# Patient Record
Sex: Male | Born: 1940
Health system: Southern US, Community
[De-identification: ages and names within clinical notes are randomized; demographics above are authoritative.]

## PROBLEM LIST (undated history)

## (undated) DIAGNOSIS — C801 Malignant (primary) neoplasm, unspecified: Secondary | ICD-10-CM

## (undated) DIAGNOSIS — E785 Hyperlipidemia, unspecified: Secondary | ICD-10-CM

## (undated) DIAGNOSIS — H409 Unspecified glaucoma: Secondary | ICD-10-CM

## (undated) DIAGNOSIS — F0390 Unspecified dementia without behavioral disturbance: Secondary | ICD-10-CM

## (undated) DIAGNOSIS — E119 Type 2 diabetes mellitus without complications: Secondary | ICD-10-CM

## (undated) DIAGNOSIS — M1990 Unspecified osteoarthritis, unspecified site: Secondary | ICD-10-CM

## (undated) HISTORY — PX: KNEE ARTHROSCOPY: SUR90

## (undated) HISTORY — DX: Unspecified dementia, unspecified severity, without behavioral disturbance, psychotic disturbance, mood disturbance, and anxiety: F03.90

## (undated) HISTORY — PX: KNEE SURGERY: SHX244

## (undated) HISTORY — PX: HERNIA REPAIR: SHX51

## (undated) HISTORY — PX: PROSTATE SURGERY: SHX751

## (undated) HISTORY — PX: BIOPSY PENIS: PRO27

---

## 2005-06-08 ENCOUNTER — Ambulatory Visit: Payer: Self-pay | Admitting: Specialist

## 2005-06-29 ENCOUNTER — Ambulatory Visit: Payer: Self-pay | Admitting: Specialist

## 2005-06-30 ENCOUNTER — Other Ambulatory Visit: Payer: Self-pay

## 2005-06-30 ENCOUNTER — Ambulatory Visit: Payer: Self-pay | Admitting: Specialist

## 2005-07-05 ENCOUNTER — Ambulatory Visit: Payer: Self-pay | Admitting: Specialist

## 2015-08-15 ENCOUNTER — Encounter: Payer: Self-pay | Admitting: Gynecology

## 2015-08-15 ENCOUNTER — Ambulatory Visit
Admission: EM | Admit: 2015-08-15 | Discharge: 2015-08-15 | Disposition: A | Discharge status: Home / Self Care | Payer: Medicare Other | Attending: Family Medicine | Admitting: Family Medicine

## 2015-08-15 DIAGNOSIS — L739 Follicular disorder, unspecified: Secondary | ICD-10-CM | POA: Diagnosis not present

## 2015-08-15 HISTORY — DX: Hyperlipidemia, unspecified: E78.5

## 2015-08-15 HISTORY — DX: Type 2 diabetes mellitus without complications: E11.9

## 2015-08-15 HISTORY — DX: Unspecified glaucoma: H40.9

## 2015-08-15 HISTORY — DX: Malignant (primary) neoplasm, unspecified: C80.1

## 2015-08-15 HISTORY — DX: Unspecified osteoarthritis, unspecified site: M19.90

## 2015-08-15 MED ORDER — CEPHALEXIN 500 MG PO CAPS: 500.0000 mg | ORAL_CAPSULE | Freq: Four times a day (QID) | ORAL | Status: DC

## 2015-08-15 NOTE — Discharge Instructions (Signed)
Be sure to take all of your antibiotics Use warm moist compresses for 20 minutes 4 times daily Use Bactroban twice daily  Keep area clean and dry with soap and water You may use ibuprofen as needed as directed for pain   Folliculitis Folliculitis is redness, soreness, and swelling (inflammation) of the hair follicles. This condition can occur anywhere on the body. People with weakened immune systems, diabetes, or obesity have a greater risk of getting folliculitis. CAUSES  Bacterial infection. This is the most common cause.  Fungal infection.  Viral infection.  Contact with certain chemicals, especially oils and tars. Long-term folliculitis can result from bacteria that live in the nostrils. The bacteria may trigger multiple outbreaks of folliculitis over time. SYMPTOMS Folliculitis most commonly occurs on the scalp, thighs, legs, back, buttocks, and areas where hair is shaved frequently. An early sign of folliculitis is a small, white or yellow, pus-filled, itchy lesion (pustule). These lesions appear on a red, inflamed follicle. They are usually less than 0.2 inches (5 mm) wide. When there is an infection of the follicle that goes deeper, it becomes a boil or furuncle. A group of closely packed boils creates a larger lesion (carbuncle). Carbuncles tend to occur in hairy, sweaty areas of the body. DIAGNOSIS  Your caregiver can usually tell what is wrong by doing a physical exam. A sample may be taken from one of the lesions and tested in a lab. This can help determine what is causing your folliculitis. TREATMENT  Treatment may include:  Applying warm compresses to the affected areas.  Taking antibiotic medicines orally or applying them to the skin.  Draining the lesions if they contain a large amount of pus or fluid.  Laser hair removal for cases of long-lasting folliculitis. This helps to prevent regrowth of the hair. HOME CARE INSTRUCTIONS  Apply warm compresses to the affected  areas as directed by your caregiver.  If antibiotics are prescribed, take them as directed. Finish them even if you start to feel better.  You may take over-the-counter medicines to relieve itching.  Do not shave irritated skin.  Follow up with your caregiver as directed. SEEK IMMEDIATE MEDICAL CARE IF:   You have increasing redness, swelling, or pain in the affected area.  You have a fever. MAKE SURE YOU:  Understand these instructions.  Will watch your condition.  Will get help right away if you are not doing well or get worse.   This information is not intended to replace advice given to you by your health care provider. Make sure you discuss any questions you have with your health care provider.   Document Released: 12/12/2001 Document Revised: 10/24/2014 Document Reviewed: 01/03/2012 Elsevier Interactive Patient Education [employer].

## 2015-08-15 NOTE — ED Provider Notes (Signed)
CSN: [id number]     Arrival date & time 08/15/15  0907 History   First MD Initiated Contact with Patient 08/15/15 1023     Chief Complaint  Patient presents with  . Mass    HPI William Valentine is a pleasant 74 y.o. male who presents with 2 weeks of bump on the back of his head. He states he went to the barber and noticed some swelling at his occiput. Denies any pain. He has noticed a clear bloody discharge over the last 24 hours. He denies any adenopathy or neck stiffness.  Denies any fever or chills. Denies any insect bites. Denies any rash. Past Medical History  Diagnosis Date  . Arthritis   . Diabetes mellitus without complication ([place])   . Glaucoma   . Cancer [facility])     prostate  . Dyslipidemia    Past Surgical History  Procedure Laterality Date  . Prostate surgery    . Hernia repair    . Knee arthroscopy Right   . Knee surgery Left   . Biopsy penis     No family history on file. Social History  Substance Use Topics  . Smoking status: Never Smoker   . Smokeless tobacco: None  . Alcohol Use: No    Review of Systems  Constitutional: Negative.   HENT: Negative.   Eyes: Negative.   Respiratory: Negative.   Cardiovascular: Negative.   Gastrointestinal: Negative.   Genitourinary: Negative.   Musculoskeletal: Negative.   Skin: Negative for color change, pallor and rash.  Neurological: Negative.   Hematological: Negative.   Psychiatric/Behavioral: Negative.     Allergies  Review of patient's allergies indicates no known allergies.  Home Medications   Prior to Admission medications   Medication Sig Start Date End Date Taking? Authorizing Provider  atorvastatin (LIPITOR) 20 MG tablet Take 20 mg by mouth daily.   Yes Historical Provider, MD  dorzolamide (TRUSOPT) 2 % ophthalmic solution 1 drop 3 (three) times daily.   Yes Historical Provider, MD  latanoprost (XALATAN) 0.005 % ophthalmic solution 1 drop at bedtime.   Yes Historical Provider, MD    lisinopril-hydrochlorothiazide (PRINZIDE,ZESTORETIC) 20-12.5 MG tablet Take 1 tablet by mouth daily.   Yes Historical Provider, MD  cephALEXin (KEFLEX) 500 MG capsule Take 1 capsule (500 mg total) by mouth 4 (four) times daily. 08/15/15   Andria Meuse, NP   Meds Ordered and Administered this Visit  Medications - No data to display  BP 149/72 mmHg  Pulse 64  Temp(Src) 97.5 F (36.4 C) (Oral)  Resp 16  Ht 5\' 11"  (1.803 m)  Wt 160 lb (72.576 kg)  BMI 22.33 kg/m2  SpO2 100% No data found.   Physical Exam  Constitutional: He is oriented to person, place, and time. He appears well-developed and well-nourished. No distress.  HENT:  Head: Normocephalic and atraumatic.  Eyes: Conjunctivae are normal. No scleral icterus.  Neck: Normal range of motion.  Cardiovascular: Normal rate, regular rhythm and normal heart sounds.   Pulmonary/Chest: Effort normal and breath sounds normal. No respiratory distress.  Abdominal: Soft. Bowel sounds are normal. He exhibits no distension.  Musculoskeletal: Normal range of motion. He exhibits no edema or tenderness.  Good ROM neck No adenopathy  Neurological: He is alert and oriented to person, place, and time. No cranial nerve deficit.  Skin: Skin is warm and dry. Lesion noted. No rash noted. No cyanosis. Nails show no clubbing.     Psychiatric: He has a normal mood and affect.  His speech is normal and behavior is normal. Judgment and thought content normal. Cognition and memory are normal.  Nursing note and vitals reviewed.   ED Course  Procedures (including critical care time)  MDM   1. Folliculitis   He has good spontaneous drainage.  Will cover with 10 days of antibiotics.  Plan: Rx as per orders;  benefits, risks, potential side effects reviewed.   warm moist compresses for 20 minutes 4 times daily Use Bactroban twice daily  Keep area clean and dry with soap and water ibuprofen as needed as directed for pain  Seek additional medical  care if symptoms worsen or are not improving in next 2-3 days     Andria Meuse, NP 08/15/15 1037

## 2015-08-15 NOTE — ED Notes (Cosign Needed)
Patient stated x 3 week notice bump on back of head. Per patient thought is was an hair bump. Pt. Now stated drainage and painful.

## 2015-08-18 ENCOUNTER — Encounter: Payer: Self-pay | Admitting: Emergency Medicine

## 2015-08-18 ENCOUNTER — Ambulatory Visit
Admission: EM | Admit: 2015-08-18 | Discharge: 2015-08-18 | Disposition: A | Discharge status: Home / Self Care | Payer: Medicare Other | Attending: Family Medicine | Admitting: Family Medicine

## 2015-08-18 DIAGNOSIS — L02811 Cutaneous abscess of head [any part, except face]: Secondary | ICD-10-CM

## 2015-08-18 MED ORDER — SULFAMETHOXAZOLE-TRIMETHOPRIM 800-160 MG PO TABS: 1.0000 | ORAL_TABLET | Freq: Two times a day (BID) | ORAL | Status: DC

## 2015-08-18 MED ORDER — MUPIROCIN 2 % EX OINT: 1.0000 "application " | TOPICAL_OINTMENT | Freq: Three times a day (TID) | CUTANEOUS | Status: DC

## 2015-08-18 NOTE — Discharge Instructions (Signed)
Abscess °An abscess (boil or furuncle) is an infected area on or under the skin. This area is filled with yellowish-white fluid (pus) and other material (debris). °HOME CARE  °· Only take medicines as told by your doctor. °· If you were given antibiotic medicine, take it as directed. Finish the medicine even if you start to feel better. °· If gauze is used, follow your doctor's directions for changing the gauze. °· To avoid spreading the infection: °¨ Keep your abscess covered with a bandage. °¨ Wash your hands well. °¨ Do not share personal care items, towels, or whirlpools with others. °¨ Avoid skin contact with others. °· Keep your skin and clothes clean around the abscess. °· Keep all doctor visits as told. °GET HELP RIGHT AWAY IF:  °· You have more pain, puffiness (swelling), or redness in the wound site. °· You have more fluid or blood coming from the wound site. °· You have muscle aches, chills, or you feel sick. °· You have a fever. °MAKE SURE YOU:  °· Understand these instructions. °· Will watch your condition. °· Will get help right away if you are not doing well or get worse. °  °This information is not intended to replace advice given to you by your health care provider. Make sure you discuss any questions you have with your health care provider. °  °Document Released: 03/21/2008 Document Revised: 04/03/2012 Document Reviewed: 12/17/2011 °Elsevier Interactive Patient Education ©2016 Elsevier Inc. ° °

## 2015-08-18 NOTE — ED Notes (Signed)
Patient here for follow-up visit to abscess on his head.

## 2015-08-18 NOTE — ED Provider Notes (Signed)
CSN: [id number]     Arrival date & time 08/18/15  1324 History   First MD Initiated Contact with Patient 08/18/15 [phone]    Nurses notes were reviewed.  Chief Complaint  Patient presents with  . Abscess   patient lesion on baclofen for about 2 weeks. He was seen Saturday placed on Keflex 504 times a day which she states really hasn't helped. There is a note for him to use Bactroban but I do not see that a prescription was actually given to him before and he states that using anything as far as an ointment. (Consider location/radiation/quality/duration/timing/severity/associated sxs/prior Treatment) Patient is a 74 y.o. male presenting with abscess. The history is provided by the patient. No language interpreter was used.  Abscess Location:  Head/neck Abscess quality: draining, induration, painful, redness and warmth   Abscess quality: no fluctuance   Red streaking: no   Duration:  2 weeks Progression:  Worsening Pain details:    Quality:  Pressure and throbbing Chronicity:  New Context: diabetes   Relieved by:  Nothing Ineffective treatments:  Oral antibiotics Associated symptoms: no anorexia, no fatigue, no fever, no headaches, no nausea and no vomiting   Risk factors: no family hx of MRSA and no hx of MRSA     Past Medical History  Diagnosis Date  . Arthritis   . Diabetes mellitus without complication ([place])   . Glaucoma   . Cancer [facility])     prostate  . Dyslipidemia    Past Surgical History  Procedure Laterality Date  . Prostate surgery    . Hernia repair    . Knee arthroscopy Right   . Knee surgery Left   . Biopsy penis     History reviewed. No pertinent family history. Social History  Substance Use Topics  . Smoking status: Never Smoker   . Smokeless tobacco: None  . Alcohol Use: No    Review of Systems  Constitutional: Negative for fever and fatigue.  Gastrointestinal: Negative for nausea, vomiting and anorexia.  Neurological: Negative for headaches.  All  other systems reviewed and are negative.   Allergies  Review of patient's allergies indicates no known allergies.  Home Medications   Prior to Admission medications   Medication Sig Start Date End Date Taking? Authorizing Provider  atorvastatin (LIPITOR) 20 MG tablet Take 20 mg by mouth daily.    Historical Provider, MD  cephALEXin (KEFLEX) 500 MG capsule Take 1 capsule (500 mg total) by mouth 4 (four) times daily. 08/15/15   Andria Meuse, NP  dorzolamide (TRUSOPT) 2 % ophthalmic solution 1 drop 3 (three) times daily.    Historical Provider, MD  latanoprost (XALATAN) 0.005 % ophthalmic solution 1 drop at bedtime.    Historical Provider, MD  lisinopril-hydrochlorothiazide (PRINZIDE,ZESTORETIC) 20-12.5 MG tablet Take 1 tablet by mouth daily.    Historical Provider, MD  mupirocin ointment (BACTROBAN) 2 % Apply 1 application topically 3 (three) times daily. 08/18/15   Frederich Cha, MD  sulfamethoxazole-trimethoprim (BACTRIM DS,SEPTRA DS) 800-160 MG tablet Take 1 tablet by mouth 2 (two) times daily. 08/18/15   Frederich Cha, MD   Meds Ordered and Administered this Visit  Medications - No data to display  BP 152/76 mmHg  Pulse 72  Temp(Src) 96.9 F (36.1 C) (Tympanic)  Resp 17  Ht 5\' 11"  (1.803 m)  Wt 170 lb (77.111 kg)  BMI 23.72 kg/m2  SpO2 100% No data found.   Physical Exam  Constitutional: He is oriented to person, place, and time. He  appears well-developed and well-nourished.  HENT:  Head: Normocephalic and atraumatic.  Eyes: Pupils are equal, round, and reactive to light.  Neck: Neck supple.  Musculoskeletal: Normal range of motion.  Neurological: He is alert and oriented to person, place, and time.  Skin: Rash noted. There is erythema.  Patient has abscess on the back of the head just below except for area with pressure drainage and pus elicited from the abscess.  Vitals reviewed.   ED Course  Procedures (including critical care time)  Labs Review Labs Reviewed - No  data to display  Imaging Review No results found.   Visual Acuity Review  Right Eye Distance:   Left Eye Distance:   Bilateral Distance:    Right Eye Near:   Left Eye Near:    Bilateral Near:         MDM   1. Abscess of scalp    Abscess scalp appears be worsening. Not fluctuant because of straining. Place on Septra DS 1 tablet twice a day stop the Keflex. Place on Bactrim and ointment applied to this lesion 3 times a day for the next 10 days return to office follow-up next Monday 6 days from now. But if becomes worse to please come in Thursday for me to reevaluate.    Frederich Cha, MD 08/18/15 1009

## 2018-11-26 DIAGNOSIS — E785 Hyperlipidemia, unspecified: Secondary | ICD-10-CM | POA: Diagnosis not present

## 2018-11-26 DIAGNOSIS — R7303 Prediabetes: Secondary | ICD-10-CM | POA: Diagnosis not present

## 2018-11-26 DIAGNOSIS — Z0000 Encounter for general adult medical examination without abnormal findings: Secondary | ICD-10-CM | POA: Diagnosis not present

## 2018-11-26 DIAGNOSIS — E7800 Pure hypercholesterolemia, unspecified: Secondary | ICD-10-CM | POA: Diagnosis not present

## 2019-05-08 ENCOUNTER — Other Ambulatory Visit: Payer: Self-pay

## 2019-05-08 NOTE — Patient Outreach (Signed)
[facility]) Care Management  05/08/2019  William Valentine 27-Feb-1941 [id number]   Medication Adherence call to Mr. Armanie Martine left a message  for patient to call back patient is past due on Lisinopril/Hctz 20/12.5 mg under [place].   [place] Management Direct Dial [phone]  Fax [phone] [email or web address]

## 2019-05-27 ENCOUNTER — Other Ambulatory Visit: Payer: Self-pay

## 2019-05-27 NOTE — Patient Outreach (Signed)
[facility]) Care Management  05/27/2019  DACARI BECKSTRAND Mar 31, 1941 [id number]   Medication Adherence call to Mr. Reno Clasby HIPPA Compliant Voice message left with a call back number. Mr. Ptacek is showing past due on Lisinopril/Hctz 20/12.5 mg under [place].   [place] Management Direct Dial [phone]  Fax [phone] [email or web address]

## 2019-05-28 DIAGNOSIS — R7303 Prediabetes: Secondary | ICD-10-CM | POA: Diagnosis not present

## 2019-05-28 DIAGNOSIS — M1711 Unilateral primary osteoarthritis, right knee: Secondary | ICD-10-CM | POA: Diagnosis not present

## 2019-05-28 DIAGNOSIS — I10 Essential (primary) hypertension: Secondary | ICD-10-CM | POA: Diagnosis not present

## 2019-06-27 ENCOUNTER — Other Ambulatory Visit: Payer: Self-pay

## 2019-06-27 DIAGNOSIS — Z20822 Contact with and (suspected) exposure to covid-19: Secondary | ICD-10-CM

## 2019-06-28 LAB — NOVEL CORONAVIRUS, NAA: SARS-CoV-2, NAA: NOT DETECTED

## 2019-07-03 ENCOUNTER — Telehealth: Payer: Self-pay | Admitting: General Practice

## 2019-07-03 NOTE — Telephone Encounter (Signed)
Negative COVID results given. Patient results "NOT Detected." Caller expressed understanding. ° °

## 2019-07-11 DIAGNOSIS — M25461 Effusion, right knee: Secondary | ICD-10-CM | POA: Diagnosis not present

## 2019-07-11 DIAGNOSIS — M1711 Unilateral primary osteoarthritis, right knee: Secondary | ICD-10-CM | POA: Diagnosis not present

## 2019-07-11 DIAGNOSIS — M25561 Pain in right knee: Secondary | ICD-10-CM | POA: Diagnosis not present

## 2019-07-11 DIAGNOSIS — M170 Bilateral primary osteoarthritis of knee: Secondary | ICD-10-CM | POA: Diagnosis not present

## 2019-07-11 DIAGNOSIS — G8929 Other chronic pain: Secondary | ICD-10-CM | POA: Diagnosis not present

## 2019-07-22 ENCOUNTER — Other Ambulatory Visit: Payer: Self-pay

## 2019-07-22 DIAGNOSIS — Z20822 Contact with and (suspected) exposure to covid-19: Secondary | ICD-10-CM

## 2019-07-22 DIAGNOSIS — Z20828 Contact with and (suspected) exposure to other viral communicable diseases: Secondary | ICD-10-CM

## 2019-07-24 LAB — NOVEL CORONAVIRUS, NAA: SARS-CoV-2, NAA: NOT DETECTED

## 2019-07-29 DIAGNOSIS — G8929 Other chronic pain: Secondary | ICD-10-CM | POA: Diagnosis not present

## 2019-07-29 DIAGNOSIS — M25561 Pain in right knee: Secondary | ICD-10-CM | POA: Diagnosis not present

## 2019-07-29 DIAGNOSIS — I10 Essential (primary) hypertension: Secondary | ICD-10-CM | POA: Diagnosis not present

## 2019-07-29 DIAGNOSIS — M1711 Unilateral primary osteoarthritis, right knee: Secondary | ICD-10-CM | POA: Diagnosis not present

## 2019-07-29 DIAGNOSIS — E559 Vitamin D deficiency, unspecified: Secondary | ICD-10-CM | POA: Diagnosis not present

## 2019-07-29 DIAGNOSIS — M1710 Unilateral primary osteoarthritis, unspecified knee: Secondary | ICD-10-CM | POA: Diagnosis not present

## 2019-07-29 DIAGNOSIS — D649 Anemia, unspecified: Secondary | ICD-10-CM | POA: Diagnosis not present

## 2019-08-05 ENCOUNTER — Other Ambulatory Visit: Payer: Self-pay

## 2019-08-05 DIAGNOSIS — Z20822 Contact with and (suspected) exposure to covid-19: Secondary | ICD-10-CM

## 2019-08-07 LAB — NOVEL CORONAVIRUS, NAA: SARS-CoV-2, NAA: NOT DETECTED

## 2019-08-12 DIAGNOSIS — Z01812 Encounter for preprocedural laboratory examination: Secondary | ICD-10-CM | POA: Diagnosis not present

## 2019-08-14 DIAGNOSIS — Z791 Long term (current) use of non-steroidal anti-inflammatories (NSAID): Secondary | ICD-10-CM | POA: Diagnosis not present

## 2019-08-14 DIAGNOSIS — E559 Vitamin D deficiency, unspecified: Secondary | ICD-10-CM | POA: Diagnosis not present

## 2019-08-14 DIAGNOSIS — Z7982 Long term (current) use of aspirin: Secondary | ICD-10-CM | POA: Diagnosis not present

## 2019-08-14 DIAGNOSIS — M1711 Unilateral primary osteoarthritis, right knee: Secondary | ICD-10-CM | POA: Diagnosis not present

## 2019-08-14 DIAGNOSIS — E119 Type 2 diabetes mellitus without complications: Secondary | ICD-10-CM | POA: Diagnosis not present

## 2019-08-14 DIAGNOSIS — Z79899 Other long term (current) drug therapy: Secondary | ICD-10-CM | POA: Diagnosis not present

## 2019-08-14 DIAGNOSIS — G8918 Other acute postprocedural pain: Secondary | ICD-10-CM | POA: Diagnosis not present

## 2019-08-14 DIAGNOSIS — I10 Essential (primary) hypertension: Secondary | ICD-10-CM | POA: Diagnosis not present

## 2019-08-14 DIAGNOSIS — E785 Hyperlipidemia, unspecified: Secondary | ICD-10-CM | POA: Diagnosis not present

## 2019-08-15 DIAGNOSIS — M1711 Unilateral primary osteoarthritis, right knee: Secondary | ICD-10-CM | POA: Diagnosis not present

## 2019-08-15 DIAGNOSIS — E559 Vitamin D deficiency, unspecified: Secondary | ICD-10-CM | POA: Diagnosis not present

## 2019-08-15 DIAGNOSIS — E785 Hyperlipidemia, unspecified: Secondary | ICD-10-CM | POA: Diagnosis not present

## 2019-08-15 DIAGNOSIS — E119 Type 2 diabetes mellitus without complications: Secondary | ICD-10-CM | POA: Diagnosis not present

## 2019-08-15 DIAGNOSIS — G8918 Other acute postprocedural pain: Secondary | ICD-10-CM | POA: Diagnosis not present

## 2019-08-15 DIAGNOSIS — I10 Essential (primary) hypertension: Secondary | ICD-10-CM | POA: Diagnosis not present

## 2019-08-15 DIAGNOSIS — Z791 Long term (current) use of non-steroidal anti-inflammatories (NSAID): Secondary | ICD-10-CM | POA: Diagnosis not present

## 2019-08-15 DIAGNOSIS — Z79899 Other long term (current) drug therapy: Secondary | ICD-10-CM | POA: Diagnosis not present

## 2019-08-15 DIAGNOSIS — Z7982 Long term (current) use of aspirin: Secondary | ICD-10-CM | POA: Diagnosis not present

## 2019-08-16 DIAGNOSIS — M25561 Pain in right knee: Secondary | ICD-10-CM | POA: Diagnosis not present

## 2019-08-16 DIAGNOSIS — E559 Vitamin D deficiency, unspecified: Secondary | ICD-10-CM | POA: Diagnosis not present

## 2019-08-16 DIAGNOSIS — G8929 Other chronic pain: Secondary | ICD-10-CM | POA: Diagnosis not present

## 2019-08-16 DIAGNOSIS — G8918 Other acute postprocedural pain: Secondary | ICD-10-CM | POA: Diagnosis not present

## 2019-08-16 DIAGNOSIS — Z79899 Other long term (current) drug therapy: Secondary | ICD-10-CM | POA: Diagnosis not present

## 2019-08-16 DIAGNOSIS — Z7982 Long term (current) use of aspirin: Secondary | ICD-10-CM | POA: Diagnosis not present

## 2019-08-16 DIAGNOSIS — E785 Hyperlipidemia, unspecified: Secondary | ICD-10-CM | POA: Diagnosis not present

## 2019-08-16 DIAGNOSIS — E119 Type 2 diabetes mellitus without complications: Secondary | ICD-10-CM | POA: Diagnosis not present

## 2019-08-16 DIAGNOSIS — I10 Essential (primary) hypertension: Secondary | ICD-10-CM | POA: Diagnosis not present

## 2019-08-16 DIAGNOSIS — Z791 Long term (current) use of non-steroidal anti-inflammatories (NSAID): Secondary | ICD-10-CM | POA: Diagnosis not present

## 2019-08-16 DIAGNOSIS — M1711 Unilateral primary osteoarthritis, right knee: Secondary | ICD-10-CM | POA: Diagnosis not present

## 2019-08-29 DIAGNOSIS — M1711 Unilateral primary osteoarthritis, right knee: Secondary | ICD-10-CM | POA: Diagnosis not present

## 2019-08-30 DIAGNOSIS — Z4789 Encounter for other orthopedic aftercare: Secondary | ICD-10-CM | POA: Diagnosis not present

## 2019-09-03 DIAGNOSIS — Z4789 Encounter for other orthopedic aftercare: Secondary | ICD-10-CM | POA: Diagnosis not present

## 2019-09-06 DIAGNOSIS — Z4789 Encounter for other orthopedic aftercare: Secondary | ICD-10-CM | POA: Diagnosis not present

## 2019-09-09 DIAGNOSIS — Z4789 Encounter for other orthopedic aftercare: Secondary | ICD-10-CM | POA: Diagnosis not present

## 2019-09-20 DIAGNOSIS — Z4789 Encounter for other orthopedic aftercare: Secondary | ICD-10-CM | POA: Diagnosis not present

## 2019-09-30 DIAGNOSIS — Z96653 Presence of artificial knee joint, bilateral: Secondary | ICD-10-CM | POA: Diagnosis not present

## 2019-09-30 DIAGNOSIS — Z471 Aftercare following joint replacement surgery: Secondary | ICD-10-CM | POA: Diagnosis not present

## 2021-09-20 ENCOUNTER — Emergency Department
Admission: EM | Admit: 2021-09-20 | Discharge: 2021-09-20 | Disposition: A | Discharge status: Home / Self Care | Payer: Medicare HMO | Attending: Emergency Medicine | Admitting: Emergency Medicine

## 2021-09-20 ENCOUNTER — Emergency Department: Payer: Medicare HMO

## 2021-09-20 ENCOUNTER — Other Ambulatory Visit: Payer: Self-pay

## 2021-09-20 DIAGNOSIS — Z5321 Procedure and treatment not carried out due to patient leaving prior to being seen by health care provider: Secondary | ICD-10-CM | POA: Insufficient documentation

## 2021-09-20 DIAGNOSIS — W19XXXA Unspecified fall, initial encounter: Secondary | ICD-10-CM | POA: Insufficient documentation

## 2021-09-20 DIAGNOSIS — R42 Dizziness and giddiness: Secondary | ICD-10-CM | POA: Insufficient documentation

## 2021-09-20 DIAGNOSIS — F0390 Unspecified dementia without behavioral disturbance: Secondary | ICD-10-CM | POA: Diagnosis present

## 2021-09-20 LAB — COMPREHENSIVE METABOLIC PANEL
ALT: 19 U/L (ref 0–44)
AST: 22 U/L (ref 15–41)
Albumin: 4.3 g/dL (ref 3.5–5.0)
Alkaline Phosphatase: 58 U/L (ref 38–126)
Anion gap: 6 (ref 5–15)
BUN: 43 mg/dL — ABNORMAL HIGH (ref 8–23)
CO2: 29 mmol/L (ref 22–32)
Calcium: 9.4 mg/dL (ref 8.9–10.3)
Chloride: 104 mmol/L (ref 98–111)
Creatinine, Ser: 1.22 mg/dL (ref 0.61–1.24)
GFR, Estimated: 60 mL/min — ABNORMAL LOW (ref >60)
Glucose, Bld: 128 mg/dL — ABNORMAL HIGH (ref 70–99)
Potassium: 3.8 mmol/L (ref 3.5–5.1)
Sodium: 139 mmol/L (ref 135–145)
Total Bilirubin: 1.4 mg/dL — ABNORMAL HIGH (ref 0.3–1.2)
Total Protein: 7.7 g/dL (ref 6.5–8.1)

## 2021-09-20 LAB — TROPONIN I (HIGH SENSITIVITY)
Troponin I (High Sensitivity): 45 ng/L — ABNORMAL HIGH (ref <18)
Troponin I (High Sensitivity): 47 ng/L — ABNORMAL HIGH (ref <18)

## 2021-09-20 LAB — CBC
HCT: 41.3 % (ref 39.0–52.0)
Hemoglobin: 13.4 g/dL (ref 13.0–17.0)
MCH: 28.9 pg (ref 26.0–34.0)
MCHC: 32.4 g/dL (ref 30.0–36.0)
MCV: 89 fL (ref 80.0–100.0)
Platelets: 210 10*3/uL (ref 150–400)
RBC: 4.64 MIL/uL (ref 4.22–5.81)
RDW: 15.6 % — ABNORMAL HIGH (ref 11.5–15.5)
WBC: 8.5 10*3/uL (ref 4.0–10.5)
nRBC: 0 % (ref 0.0–0.2)

## 2021-09-20 NOTE — ED Notes (Signed)
No answer when called several times from lobby; no answer when phone # listed in chart called 

## 2021-09-20 NOTE — ED Triage Notes (Signed)
Pt in family states he fell twice today states no hx of the same. Family states he did pass out both times prior to falling. Wife states he does have dementia and is poor historian. Wife states he hit head this am, pt unable to recall. Did have glaucoma surgery last week.

## 2021-09-20 NOTE — ED Notes (Signed)
No answer when called several times from lobby 

## 2021-09-20 NOTE — ED Notes (Signed)
Called X2 for reassessment of vitals

## 2021-09-21 ENCOUNTER — Encounter: Payer: Medicare HMO | Admitting: Speech Pathology

## 2022-05-12 ENCOUNTER — Ambulatory Visit: Payer: Medicare Other | Admitting: Podiatry

## 2022-05-12 ENCOUNTER — Encounter: Payer: Self-pay | Admitting: Podiatry

## 2022-05-12 DIAGNOSIS — M79674 Pain in right toe(s): Secondary | ICD-10-CM | POA: Diagnosis not present

## 2022-05-12 DIAGNOSIS — B351 Tinea unguium: Secondary | ICD-10-CM

## 2022-05-12 DIAGNOSIS — M79675 Pain in left toe(s): Secondary | ICD-10-CM

## 2022-05-12 NOTE — Progress Notes (Signed)
  Subjective:  Patient ID: William Valentine, male    DOB: [birth year],  MRN: [id number]  No chief complaint on file.  81 y.o. male returns for the above complaint.  Patient presents with thickened dystrophic toenails x10 mild pain on palpation.  Hurts with ambulation and he would like to have them debrided and he has not able to do it himself  Objective:  There were no vitals filed for this visit. Podiatric Exam: Vascular: dorsalis pedis and posterior tibial pulses are palpable bilateral. Capillary return is immediate. Temperature gradient is WNL. Skin turgor WNL  Sensorium: Normal Semmes Weinstein monofilament test. Normal tactile sensation bilaterally. Nail Exam: Pt has thick disfigured discolored nails with subungual debris noted bilateral entire nail hallux through fifth toenails.  Pain on palpation to the nails. Ulcer Exam: There is no evidence of ulcer or pre-ulcerative changes or infection. Orthopedic Exam: Muscle tone and strength are WNL. No limitations in general ROM. No crepitus or effusions noted.  Skin: No Porokeratosis. No infection or ulcers    Assessment & Plan:   1. Pain due to onychomycosis of toenails of both feet     Patient was evaluated and treated and all questions answered.  Onychomycosis with pain  -Nails palliatively debrided as below. -Educated on self-care  Procedure: Nail Debridement Rationale: pain  Type of Debridement: manual, sharp debridement. Instrumentation: Nail nipper, rotary burr. Number of Nails: 10  Procedures and Treatment: Consent by patient was obtained for treatment procedures. The patient understood the discussion of treatment and procedures well. All questions were answered thoroughly reviewed. Debridement of mycotic and hypertrophic toenails, 1 through 5 bilateral and clearing of subungual debris. No ulceration, no infection noted.  Return Visit-Office Procedure: Patient instructed to return to the office for a follow up visit 3 months  for continued evaluation and treatment.  Boneta Lucks, DPM    No follow-ups on file.

## 2022-05-25 ENCOUNTER — Observation Stay: Payer: Medicare Other

## 2022-05-25 ENCOUNTER — Observation Stay
Admission: EM | Admit: 2022-05-25 | Discharge: 2022-06-01 | Disposition: A | Discharge status: Skilled Nursing Facility | Payer: Medicare Other | Attending: Internal Medicine | Admitting: Internal Medicine

## 2022-05-25 ENCOUNTER — Observation Stay (HOSPITAL_BASED_OUTPATIENT_CLINIC_OR_DEPARTMENT_OTHER)
Admit: 2022-05-25 | Discharge: 2022-05-25 | Disposition: A | Discharge status: Home / Self Care | Payer: Medicare Other | Attending: Internal Medicine | Admitting: Internal Medicine

## 2022-05-25 ENCOUNTER — Other Ambulatory Visit: Payer: Self-pay

## 2022-05-25 ENCOUNTER — Emergency Department: Payer: Medicare Other

## 2022-05-25 DIAGNOSIS — Z79899 Other long term (current) drug therapy: Secondary | ICD-10-CM | POA: Insufficient documentation

## 2022-05-25 DIAGNOSIS — R531 Weakness: Secondary | ICD-10-CM | POA: Insufficient documentation

## 2022-05-25 DIAGNOSIS — R2681 Unsteadiness on feet: Secondary | ICD-10-CM | POA: Insufficient documentation

## 2022-05-25 DIAGNOSIS — R55 Syncope and collapse: Secondary | ICD-10-CM | POA: Diagnosis not present

## 2022-05-25 DIAGNOSIS — I10 Essential (primary) hypertension: Secondary | ICD-10-CM | POA: Diagnosis present

## 2022-05-25 DIAGNOSIS — R778 Other specified abnormalities of plasma proteins: Secondary | ICD-10-CM | POA: Insufficient documentation

## 2022-05-25 DIAGNOSIS — Z9181 History of falling: Secondary | ICD-10-CM | POA: Diagnosis not present

## 2022-05-25 DIAGNOSIS — R4182 Altered mental status, unspecified: Secondary | ICD-10-CM | POA: Diagnosis present

## 2022-05-25 DIAGNOSIS — R7989 Other specified abnormal findings of blood chemistry: Secondary | ICD-10-CM | POA: Diagnosis present

## 2022-05-25 DIAGNOSIS — F0390 Unspecified dementia without behavioral disturbance: Secondary | ICD-10-CM | POA: Diagnosis present

## 2022-05-25 DIAGNOSIS — E876 Hypokalemia: Secondary | ICD-10-CM | POA: Diagnosis not present

## 2022-05-25 DIAGNOSIS — N179 Acute kidney failure, unspecified: Secondary | ICD-10-CM | POA: Diagnosis not present

## 2022-05-25 LAB — CBC WITH DIFFERENTIAL/PLATELET
Abs Immature Granulocytes: 0.02 10*3/uL (ref 0.00–0.07)
Basophils Absolute: 0 10*3/uL (ref 0.0–0.1)
Basophils Relative: 1 %
Eosinophils Absolute: 0.2 10*3/uL (ref 0.0–0.5)
Eosinophils Relative: 3 %
HCT: 35.1 % — ABNORMAL LOW (ref 39.0–52.0)
Hemoglobin: 11 g/dL — ABNORMAL LOW (ref 13.0–17.0)
Immature Granulocytes: 0 %
Lymphocytes Relative: 11 %
Lymphs Abs: 0.7 10*3/uL (ref 0.7–4.0)
MCH: 27.2 pg (ref 26.0–34.0)
MCHC: 31.3 g/dL (ref 30.0–36.0)
MCV: 86.9 fL (ref 80.0–100.0)
Monocytes Absolute: 0.7 10*3/uL (ref 0.1–1.0)
Monocytes Relative: 11 %
Neutro Abs: 4.5 10*3/uL (ref 1.7–7.7)
Neutrophils Relative %: 74 %
Platelets: 307 10*3/uL (ref 150–400)
RBC: 4.04 MIL/uL — ABNORMAL LOW (ref 4.22–5.81)
RDW: 16.4 % — ABNORMAL HIGH (ref 11.5–15.5)
WBC: 6.1 10*3/uL (ref 4.0–10.5)
nRBC: 0 % (ref 0.0–0.2)

## 2022-05-25 LAB — LACTIC ACID, PLASMA
Lactic Acid, Venous: 1.9 mmol/L (ref 0.5–1.9)
Lactic Acid, Venous: 1.7 mmol/L (ref 0.5–1.9)

## 2022-05-25 LAB — COMPREHENSIVE METABOLIC PANEL
ALT: 25 U/L (ref 0–44)
AST: 28 U/L (ref 15–41)
Albumin: 3.7 g/dL (ref 3.5–5.0)
Alkaline Phosphatase: 58 U/L (ref 38–126)
Anion gap: 8 (ref 5–15)
BUN: 44 mg/dL — ABNORMAL HIGH (ref 8–23)
CO2: 27 mmol/L (ref 22–32)
Calcium: 8.9 mg/dL (ref 8.9–10.3)
Chloride: 114 mmol/L — ABNORMAL HIGH (ref 98–111)
Creatinine, Ser: 1.48 mg/dL — ABNORMAL HIGH (ref 0.61–1.24)
GFR, Estimated: 47 mL/min — ABNORMAL LOW (ref >60)
Glucose, Bld: 86 mg/dL (ref 70–99)
Potassium: 2.9 mmol/L — ABNORMAL LOW (ref 3.5–5.1)
Sodium: 149 mmol/L — ABNORMAL HIGH (ref 135–145)
Total Bilirubin: 1.1 mg/dL (ref 0.3–1.2)
Total Protein: 6.9 g/dL (ref 6.5–8.1)

## 2022-05-25 LAB — TROPONIN I (HIGH SENSITIVITY)
Troponin I (High Sensitivity): 52 ng/L — ABNORMAL HIGH (ref <18)
Troponin I (High Sensitivity): 48 ng/L — ABNORMAL HIGH (ref <18)

## 2022-05-25 LAB — APTT: aPTT: 27 seconds (ref 24–36)

## 2022-05-25 LAB — PROTIME-INR
INR: 1 (ref 0.8–1.2)
Prothrombin Time: 13.5 seconds (ref 11.4–15.2)

## 2022-05-25 LAB — MAGNESIUM: Magnesium: 2.2 mg/dL (ref 1.7–2.4)

## 2022-05-25 LAB — PHOSPHORUS: Phosphorus: 2.9 mg/dL (ref 2.5–4.6)

## 2022-05-25 LAB — BRAIN NATRIURETIC PEPTIDE: B Natriuretic Peptide: 59 pg/mL (ref 0.0–100.0)

## 2022-05-25 MED ORDER — HYDRALAZINE HCL 20 MG/ML IJ SOLN: 5.0000 mg | Freq: Four times a day (QID) | INTRAMUSCULAR | Status: AC | PRN

## 2022-05-25 MED ORDER — POTASSIUM CHLORIDE CRYS ER 20 MEQ PO TBCR
40.0000 meq | EXTENDED_RELEASE_TABLET | Freq: Once | ORAL | Status: AC
  Administered 2022-05-25: 40 meq via ORAL
  Filled 2022-05-25: qty 2

## 2022-05-25 MED ORDER — POTASSIUM CHLORIDE 10 MEQ/100ML IV SOLN
10.0000 meq | INTRAVENOUS | Status: AC
  Administered 2022-05-25 – 2022-05-26 (×5): 10 meq via INTRAVENOUS
  Filled 2022-05-25 (×3): qty 100

## 2022-05-25 MED ORDER — ONDANSETRON HCL 4 MG/2ML IJ SOLN: 4.0000 mg | Freq: Four times a day (QID) | INTRAMUSCULAR | Status: DC | PRN

## 2022-05-25 MED ORDER — SODIUM CHLORIDE 0.9% FLUSH
3.0000 mL | Freq: Two times a day (BID) | INTRAVENOUS | Status: DC
  Administered 2022-05-25 – 2022-06-01 (×14): 3 mL via INTRAVENOUS

## 2022-05-25 MED ORDER — LACTATED RINGERS IV BOLUS
1000.0000 mL | Freq: Once | INTRAVENOUS | Status: AC
  Administered 2022-05-25: 1000 mL via INTRAVENOUS

## 2022-05-25 MED ORDER — ENOXAPARIN SODIUM 40 MG/0.4ML IJ SOSY
40.0000 mg | PREFILLED_SYRINGE | Freq: Every day | INTRAMUSCULAR | Status: DC
Start: 2022-05-25 — End: 2022-06-01
  Administered 2022-05-25 – 2022-05-31 (×7): 40 mg via SUBCUTANEOUS
  Filled 2022-05-25 (×7): qty 0.4

## 2022-05-25 MED ORDER — SENNOSIDES-DOCUSATE SODIUM 8.6-50 MG PO TABS: 1.0000 | ORAL_TABLET | Freq: Every evening | ORAL | Status: DC | PRN

## 2022-05-25 MED ORDER — ACETAMINOPHEN 325 MG PO TABS: 650.0000 mg | ORAL_TABLET | Freq: Four times a day (QID) | ORAL | Status: DC | PRN

## 2022-05-25 MED ORDER — ONDANSETRON HCL 4 MG PO TABS: 4.0000 mg | ORAL_TABLET | Freq: Four times a day (QID) | ORAL | Status: DC | PRN

## 2022-05-25 MED ORDER — ATORVASTATIN CALCIUM 20 MG PO TABS
20.0000 mg | ORAL_TABLET | Freq: Every day | ORAL | Status: DC
  Administered 2022-05-26 – 2022-06-01 (×7): 20 mg via ORAL
  Filled 2022-05-25 (×8): qty 1

## 2022-05-25 MED ORDER — ACETAMINOPHEN 650 MG RE SUPP: 650.0000 mg | Freq: Four times a day (QID) | RECTAL | Status: DC | PRN

## 2022-05-25 NOTE — ED Notes (Signed)
Repeat trop drawn, Lab to add on mag and phos

## 2022-05-25 NOTE — Assessment & Plan Note (Addendum)
Initially oral antihypertensives were held.  Following fluid resuscitation, blood pressure started trending up and medications were restarted.  Blood pressure has since been stable.

## 2022-05-25 NOTE — ED Provider Notes (Signed)
[facility] Provider Note    Event Date/Time   First MD Initiated Contact with Patient 05/25/22 1634     (approximate)   History   Seizures   HPI  William Valentine is a 81 y.o. male history dementia presents to the ER for evaluation of of seizure-like activity was witnessed by the patient's family member while they were cutting his hair.  Reportedly had shaking tremor of bilateral hands was unresponsive while sitting in the chair.  Reportedly has had drew back and his eyes were deviated upward.  States that the symptoms lasted roughly 3 minutes and the patient leaned forward to grab his head and was confused.  EMS was called and found that he was hypotensive.  He denies any chest pain or pressure is reportedly back to his baseline but denies any history of seizures.     Physical Exam   Triage Vital Signs: ED Triage Vitals [05/25/22 1626]  Enc Vitals Group     BP 126/82     Pulse Rate 76     Resp 19     Temp 97.9 F (36.6 C)     Temp Source Oral     SpO2 98 %     Weight 145 lb (65.8 kg)     Height      Head Circumference      Peak Flow      Pain Score 0     Pain Loc      Pain Edu?      Excl. in [place]?     Most recent vital signs: Vitals:   05/25/22 1905 05/25/22 1916  BP: (!) 141/72   Pulse: 68   Resp: 16   Temp:  97.9 F (36.6 C)  SpO2: 100%      Constitutional: Alert  Eyes: Conjunctivae are normal.  Head: Atraumatic. Nose: No congestion/rhinnorhea. Mouth/Throat: Mucous membranes are moist.   Neck: Painless ROM.  Cardiovascular:   Good peripheral circulation. Respiratory: Normal respiratory effort.  No retractions.  Gastrointestinal: Soft and nontender.  Musculoskeletal:  no deformity Neurologic:  MAE spontaneously. No gross focal neurologic deficits are appreciated.  Skin:  Skin is warm, dry and intact. No rash noted. Psychiatric: Mood and affect are normal. Speech and behavior are normal.    ED Results / Procedures /  Treatments   Labs (all labs ordered are listed, but only abnormal results are displayed) Labs Reviewed  COMPREHENSIVE METABOLIC PANEL - Abnormal; Notable for the following components:      Result Value   Sodium 149 (*)    Potassium 2.9 (*)    Chloride 114 (*)    BUN 44 (*)    Creatinine, Ser 1.48 (*)    GFR, Estimated 47 (*)    All other components within normal limits  CBC WITH DIFFERENTIAL/PLATELET - Abnormal; Notable for the following components:   RBC 4.04 (*)    Hemoglobin 11.0 (*)    HCT 35.1 (*)    RDW 16.4 (*)    All other components within normal limits  TROPONIN I (HIGH SENSITIVITY) - Abnormal; Notable for the following components:   Troponin I (High Sensitivity) 52 (*)    All other components within normal limits  LACTIC ACID, PLASMA  PROTIME-INR  APTT  MAGNESIUM  PHOSPHORUS  LACTIC ACID, PLASMA  URINALYSIS, COMPLETE (UACMP) WITH MICROSCOPIC  BASIC METABOLIC PANEL  CBC  MAGNESIUM  BRAIN NATRIURETIC PEPTIDE  TROPONIN I (HIGH SENSITIVITY)     EKG  ED ECG  REPORT I, Merlyn Lot, the attending physician, personally viewed and interpreted this ECG.   Date: 05/25/2022  EKG Time: 16:27  Rate: 80  Rhythm: sinus  Axis: left  Intervals: normal  ST&T Change: no stemi, no depressions    RADIOLOGY Please see ED Course for my review and interpretation.  I personally reviewed all radiographic images ordered to evaluate for the above acute complaints and reviewed radiology reports and findings.  These findings were personally discussed with the patient.  Please see medical record for radiology report.    PROCEDURES:  Critical Care performed: No  Procedures   MEDICATIONS ORDERED IN ED: Medications  atorvastatin (LIPITOR) tablet 20 mg (has no administration in time range)  sodium chloride flush (NS) 0.9 % injection 3 mL (has no administration in time range)  enoxaparin (LOVENOX) injection 40 mg (has no administration in time range)  acetaminophen  (TYLENOL) tablet 650 mg (has no administration in time range)    Or  acetaminophen (TYLENOL) suppository 650 mg (has no administration in time range)  senna-docusate (Senokot-S) tablet 1 tablet (has no administration in time range)  ondansetron (ZOFRAN) tablet 4 mg (has no administration in time range)    Or  ondansetron (ZOFRAN) injection 4 mg (has no administration in time range)  potassium chloride 10 mEq in 100 mL IVPB (has no administration in time range)  potassium chloride SA (KLOR-CON M) CR tablet 40 mEq (has no administration in time range)  hydrALAZINE (APRESOLINE) injection 5 mg (has no administration in time range)  lactated ringers bolus 1,000 mL (0 mLs Intravenous Stopped 05/25/22 1916)     IMPRESSION / MDM / ASSESSMENT AND PLAN / ED COURSE  I reviewed the triage vital signs and the nursing notes.                              Differential diagnosis includes, but is not limited to, Dehydration, sepsis, seizure, pna, uti, hypoglycemia, cva, drug effect, withdrawal, encephalitis  Patient presented to the ER for evaluation of symptoms as described above.  This presenting complaint could reflect a potentially life-threatening illness therefore the patient will be placed on continuous pulse oximetry and telemetry for monitoring.  Laboratory evaluation will be sent to evaluate for the above complaints.   The imaging as well as chest x-ray will be ordered for above differential.   Clinical Course as of 05/25/22 1931  Wed May 25, 2022  1701 CT head on my review and interpretation does not show any evidence of mass or bleed [PR]  1725 Patient's troponin is mildly elevated but roughly baseline.  He is mildly hyponatremic will give additional IV fluids as this may be dehydration related in the setting of his significant hypotension no markers of sepsis.  Imaging is reassuring.  Given his age risk factors and syncopal event and possible seizure-like activity will consult hospitalist for  admission. [PR]    Clinical Course User Index [PR] Merlyn Lot, MD    FINAL CLINICAL IMPRESSION(S) / ED DIAGNOSES   Final diagnoses:  Syncope, unspecified syncope type     Rx / DC Orders   ED Discharge Orders     None        Note:  This document was prepared using Dragon voice recognition software and may include unintentional dictation errors.    Merlyn Lot, MD 05/25/22 [phone]

## 2022-05-25 NOTE — Assessment & Plan Note (Addendum)
Secondary to dehydration.  Antihypertensive medications held and patient treated with IV fluids and creatinine normalized.

## 2022-05-25 NOTE — Assessment & Plan Note (Addendum)
Echo unrevealing.  No evidence of acute coronary syndrome and felt to be related to either demand ischemia versus renal insufficiency

## 2022-05-25 NOTE — Assessment & Plan Note (Addendum)
Noted to be hypotensive on admission and given a liter of fluids.  CT scan of head, MRI brain, EEG and echo all unremarkable.  Blood pressure has since improved & blood pressure medications restarted.  Seen by PT and OT and recommended for skilled nursing which patient will go to on 8/16.

## 2022-05-25 NOTE — Assessment & Plan Note (Addendum)
Noted to be hypokalemic on admission.  Potassium replaced.

## 2022-05-25 NOTE — Plan of Care (Signed)

## 2022-05-25 NOTE — H&P (Addendum)
History and Physical   JAYVEN NAILL QMG:[id number] DOB: 1941-01-07 DOA: 05/25/2022  PCP: Midge Minium, PA  Patient coming from: Home via EMS  I have personally briefly reviewed patient's old medical records in [place].  Chief Concern: Altered mental status  HPI: [name] is a 81 year old male with history of dementia, hypertension, insomnia, who presents emergency department for chief concerns of altered mental status.  Initial vitals in the emergency department showed temperature of 97.9, respiration rate of 16, heart rate of 80, blood pressure 115/63, SpO2 99% on room air.  Serum sodium is 149, potassium 2.9, chloride 114, bicarb 27, BUN of 44, serum creatinine 1.48, GFR 43, WBC 6.1, hemoglobin 11, platelets of 307.  High sensitive troponin was 52.  Per EDP, patient was getting a haircut via his son.  His son states that he noticed abnormal rhythmic motions of his father's extremities.  Son noticed his father's eyes rolling back and he was unresponsive for about 3 minutes.   When EMS arrived, patient was found to be hypotensive.   ED treatment: LR 1 L bolus.  At bedside, patient was able to tell me his name and his age. He was not able to tell me the current calendar year or current month after multiple attempts. He was able to tell me that the current place is hospital after given him multiple choices. He is not able to tell me the current president. Per spouse and son at bedside, this is patient's baseline.   Gerald Stabs (son at bedside), who was giving patient a shave. Son noticed that patient had shaking hands, son asked patient 'what's going on?'. Patient was non-responsive. Per son, he noticed his father's eyes rolled back. Patient was not responsive for approximately 3.5-4 minutes.   EMS was called for further evaluation. Per ED report, patient was hypotensive when EMS checked patient's vitals.  Social history: He lives with his wife. He denies history of  tobacco use. He is a social drinker, he doesn't remember his last drink. He denies history of drug use. He formerly worked as a Administrator for [employer]: Unable to complete due to advanced dementia  ED Course: Discussed with emergency medicine provider, patient requiring hospitalization for chief concerns of syncope versus seizure.  Assessment/Plan  Principal Problem:   Syncope Active Problems:   Hypokalemia   AKI (acute kidney injury) ([place])   Essential hypertension   Dementia without behavioral disturbance (HCC)   Elevated troponin   Assessment and Plan:  * Syncope - Etiology workup in progress, differentials is multifactorial hypotension, hypokalemia, NSTEMI, cardiorenal - With atlantodental widening on CT of the head, we will follow-up with CT cervical spine ordered by EDP - Hold home anti-hypertensive medications on admission - Will check BNP and follow high since her troponin - EEG and cardiac echo ordered, orthostatic vital signs ordered on admission - MRI of the brain ordered - Fall and aspiration precautions  Elevated troponin - Etiology workup in progress at this time - Low clinical suspicion for ACS as patient denies chest pain and there was no ischemic changes on EKG - Query myocardial injury versus cardiorenal - Check BNP, high sensitive troponin - If high sensitive troponin is markedly elevated we can start heparin gtt. - Complete echo ordered - AM team to consult cardiology if indicated pending repeat high sensitive troponin and complete echo  Dementia without behavioral disturbance ([place]) - Per family patient appears to be at baseline mental status at this  time  Essential hypertension - Holding home lisinopril-hydrochlorothiazide due to AKI on admission - Hydralazine 5 mg IV every 6 hours as needed for SBP greater than 180, 3 days ordered  AKI (acute kidney injury) ([place]) - Hold home antihypertensive medication lisinopril-hctz 20-12.5 mg p.o. on  admission - Hydralazine 5 mg IV every 6 hours for SBP greater than 180, 3 days ordered  Hypokalemia - Patient is on hydrochlorothiazide combination medication - Check magnesium level - Potassium chloride 10 mill equivalent by IV over 1 hour, 2 doses ordered - Potassium chloride 40 mill equivalent p.o. one-time dose ordered - Check magnesium and BMP in the a.m.  AM team to complete medical reconciliation.   Chart reviewed.   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 He has never used smokeless tobacco. He reports that he does not currently use alcohol. He reports that he does not use drugs.  No Known Allergies Family History  Problem Relation Age of Onset   Hypertension Sister    Hypertension Brother    Family history: Family history reviewed and not pertinent.  Prior to Admission medications   Medication Sig Start Date End Date Taking? Authorizing Provider  atorvastatin (LIPITOR) 20 MG tablet Take 20 mg by mouth daily.    [provider]  cephALEXin (KEFLEX) 500 MG capsule Take 1 capsule (500 mg total) by mouth 4 (four) times daily. 08/15/15   Andria Meuse, NP  dorzolamide (TRUSOPT) 2 % ophthalmic solution 1 drop 3 (three) times daily.    [provider]  latanoprost (XALATAN) 0.005 % ophthalmic solution 1 drop at bedtime.    [provider]  lisinopril-hydrochlorothiazide (PRINZIDE,ZESTORETIC) 20-12.5 MG tablet Take 1 tablet by mouth daily.    [provider]  mupirocin ointment (BACTROBAN) 2 % Apply 1 application topically 3 (three) times daily. 08/18/15   Frederich Cha, MD  sulfamethoxazole-trimethoprim (BACTRIM DS,SEPTRA DS) 800-160 MG tablet Take 1 tablet by mouth 2 (two) times daily. 08/18/15   Frederich Cha, MD   Physical Exam: Vitals:   05/25/22 1916 05/25/22 2142 05/25/22 2144 05/25/22 2151  BP:  136/75 129/71 (!) 161/82  Pulse:  (!) 53 (!) 55 90  Resp:      Temp: 97.9 F (36.6 C) 98 F (36.7 C)    TempSrc: Oral     SpO2:  100% 100%   Weight:       Constitutional: appears age-appropriate, NAD, calm, comfortable Eyes: PERRL, lids and conjunctivae normal ENMT: Mucous membranes are moist. Posterior pharynx clear of any exudate or lesions. Age-appropriate dentition. Hearing appropriate Neck: normal, supple, no masses, no thyromegaly Respiratory: clear to auscultation bilaterally, no wheezing, no crackles. Normal respiratory effort. No accessory muscle use.  Cardiovascular: Regular rate and rhythm, no murmurs / rubs / gallops. No extremity edema. 2+ pedal pulses.  No carotid bruits.  Abdomen: no tenderness, no masses palpated, no hepatosplenomegaly. Bowel sounds positive.  Musculoskeletal: no clubbing / cyanosis. No joint deformity upper and lower extremities. Good ROM, no contractures, no atrophy. Normal muscle tone.  Skin: no rashes, lesions, ulcers. No induration Neurologic: Sensation intact. Strength 5/5 in all 4.  Psychiatric: Normal judgment and insight. Alert and oriented x self and age. Normal mood.   EKG: independently reviewed, showing sinus rhythm with rate of 82, QTc 473  Chest x-ray on Admission: I personally reviewed and I agree with radiologist reading as below.  CT Cervical Spine Wo Contrast  Result Date: 05/25/2022 CLINICAL DATA:  Trauma, seizures EXAM: CT CERVICAL SPINE WITHOUT  CONTRAST TECHNIQUE: Multidetector CT imaging of the cervical spine was performed without intravenous contrast. Multiplanar CT image reconstructions were also generated. RADIATION DOSE REDUCTION: This exam was performed according to the departmental dose-optimization program which includes automated exposure control, adjustment of the mA and/or kV according to patient size and/or use of iterative reconstruction technique. COMPARISON:  09/20/2021 FINDINGS: Alignment: Reversal of lordosis may be due to muscle spasm to positioning. Alignment of the posterior margins of vertebral bodies is otherwise unremarkable. Skull base and vertebrae: No recent fracture is seen. Soft tissues and spinal canal: There is spinal stenosis at multiple levels, more so at C6-C7 level caused by bony spurs and bulging of the annulus. Disc levels: There is encroachment of neural foramina from C3 to T2 levels. Upper chest: Unremarkable. Other: Scattered arterial calcifications are seen. IMPRESSION: No recent fracture is seen in cervical spine. 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 There is encroachment of neural foramina from C3-T2 levels. Electronically Signed   By: Elmer Picker M.D.   On: 05/25/2022 18:17   DG Chest Port 1 View  Result Date: 05/25/2022 CLINICAL DATA:  Questionable sepsis. EXAM: PORTABLE CHEST 1 VIEW COMPARISON:  None Available. FINDINGS: The heart size and mediastinal contours are within normal limits. Both lungs are clear. The visualized skeletal structures are unremarkable. IMPRESSION: No active disease. Electronically Signed   By: Fidela Salisbury M.D.   On: 05/25/2022 17:13   CT HEAD WO CONTRAST (5MM)  Result Date: 05/25/2022 CLINICAL DATA:  Mental status change, unknown cause. EXAM: CT HEAD WITHOUT CONTRAST TECHNIQUE: Contiguous axial images were obtained from the base of the skull through the vertex without intravenous contrast. RADIATION DOSE REDUCTION: This exam was performed  according to the departmental dose-optimization program which includes automated exposure control, adjustment of the mA and/or kV according to patient size and/or use of iterative reconstruction technique. COMPARISON:  CT head and cervical spine 09/20/2021 FINDINGS: Brain: There is no evidence of an acute infarct, intracranial hemorrhage, mass, midline shift, or extra-axial fluid collection. Mild cerebral atrophy is within normal limits for age. A cavum septum pellucidum et vergae is noted, a normal variant. Hypodensities in the cerebral white matter bilaterally are unchanged and nonspecific but compatible with mild chronic small vessel ischemic disease. Vascular: Calcified atherosclerosis at the skull base. No hyperdense vessel. Skull: Mild widening of the atlantodental interval which is new from the prior cervical spine CT. No skull fracture. Sinuses/Orbits: Visualized paranasal sinuses and mastoid air cells are clear. Postoperative changes to the globes. Other: None. IMPRESSION: 1. No evidence of acute intracranial abnormality. 2. Mild chronic small vessel ischemic disease. 3. New atlantodental widening which could be degenerative or traumatic. Correlate for any history of recent trauma and consider cervical spine CT for further evaluation if clinically warranted. Electronically Signed  By: Logan Bores M.D.   On: 05/25/2022 17:08    Labs on Admission: I have personally reviewed following labs  CBC: Recent Labs  Lab 05/25/22 1631  WBC 6.1  NEUTROABS 4.5  HGB 11.0*  HCT 35.1*  MCV 86.9  PLT 333   Basic Metabolic Panel: Recent Labs  Lab 05/25/22 1631  NA 149*  K 2.9*  CL 114*  CO2 27  GLUCOSE 86  BUN 44*  CREATININE 1.48*  CALCIUM 8.9  MG 2.2  PHOS 2.9   GFR: CrCl cannot be calculated (Unknown ideal weight.).  Liver Function Tests: Recent Labs  Lab 05/25/22 1631  AST 28  ALT 25  ALKPHOS 58  BILITOT 1.1  PROT 6.9  ALBUMIN 3.7   Coagulation Profile: Recent Labs  Lab  05/25/22 1631  INR 1.0   Dr. Tobie Poet [facility]  If 7PM-7AM, please contact overnight-coverage provider If 7AM-7PM, please contact day coverage provider [email or web address]  05/25/2022, 11:57 PM

## 2022-05-25 NOTE — ED Triage Notes (Addendum)
Pt arrives today with [facility] from home for new onset seizure.   PMH Dementia, HTN 750ML NS given for initial bp of 66/40 18G L AC Cbg 154  12L lead showing ST changes denies cardiac history or symptoms.  Pt son stating that patient has recently had many falls, some including hitting his head. Family denies thinners.

## 2022-05-25 NOTE — Hospital Course (Addendum)
81 year old male with past medical history of dementia and hypertension who presented to the emergency room with episode of unresponsiveness followed by confusion.  Work-up unrevealing although did note some acute kidney injury which has since been treated.  Patient was evaluated by PT and OT recommended skilled nursing with placement pending.

## 2022-05-25 NOTE — Assessment & Plan Note (Signed)
-   Per family patient appears to be at baseline mental status at this time

## 2022-05-26 DIAGNOSIS — F0390 Unspecified dementia without behavioral disturbance: Secondary | ICD-10-CM | POA: Diagnosis not present

## 2022-05-26 DIAGNOSIS — R55 Syncope and collapse: Secondary | ICD-10-CM | POA: Diagnosis not present

## 2022-05-26 DIAGNOSIS — N179 Acute kidney failure, unspecified: Secondary | ICD-10-CM | POA: Diagnosis not present

## 2022-05-26 LAB — ECHOCARDIOGRAM COMPLETE
Area-P 1/2: 2.29 cm2
S' Lateral: 2.6 cm
Weight: 2320 oz

## 2022-05-26 LAB — CBC
HCT: 32 % — ABNORMAL LOW (ref 39.0–52.0)
Hemoglobin: 10.1 g/dL — ABNORMAL LOW (ref 13.0–17.0)
MCH: 27 pg (ref 26.0–34.0)
MCHC: 31.6 g/dL (ref 30.0–36.0)
MCV: 85.6 fL (ref 80.0–100.0)
Platelets: 252 10*3/uL (ref 150–400)
RBC: 3.74 MIL/uL — ABNORMAL LOW (ref 4.22–5.81)
RDW: 16.1 % — ABNORMAL HIGH (ref 11.5–15.5)
WBC: 5.7 10*3/uL (ref 4.0–10.5)
nRBC: 0 % (ref 0.0–0.2)

## 2022-05-26 LAB — BASIC METABOLIC PANEL
Anion gap: 5 (ref 5–15)
BUN: 32 mg/dL — ABNORMAL HIGH (ref 8–23)
CO2: 27 mmol/L (ref 22–32)
Calcium: 8.6 mg/dL — ABNORMAL LOW (ref 8.9–10.3)
Chloride: 111 mmol/L (ref 98–111)
Creatinine, Ser: 1.33 mg/dL — ABNORMAL HIGH (ref 0.61–1.24)
GFR, Estimated: 54 mL/min — ABNORMAL LOW (ref >60)
Glucose, Bld: 91 mg/dL (ref 70–99)
Potassium: 3.8 mmol/L (ref 3.5–5.1)
Sodium: 143 mmol/L (ref 135–145)

## 2022-05-26 LAB — MAGNESIUM: Magnesium: 2.1 mg/dL (ref 1.7–2.4)

## 2022-05-26 NOTE — Progress Notes (Signed)
PROGRESS NOTE    William Valentine  JAS:[id number] DOB: 11-28-1940 DOA: 05/25/2022 PCP: Midge Minium, PA    Assessment & Plan:   Principal Problem:   Syncope Active Problems:   Hypokalemia   AKI (acute kidney injury) ([place])   Essential hypertension   Dementia without behavioral disturbance (HCC)   Elevated troponin  Assessment and Plan: Syncope: etiology unclear. Echo is pending. EEG ordered. MRI brain shows asymmetric atrophy involving the mesial left temporal lobe/left hippocampal formation & no other acute intracranial abnormality.   Elevated troponin: likely secondary to demand ischemia. Echo ordered   Dementia: continue w/ supportive care   HTN: continue to hold lisinopril-HCTZ secondary to AKI. IV hydralazine prn    AKI: Cr is trending down from day prior. Avoid nephrotoxic meds    Hypokalemia: WNL today. Mg is WNL   Normocytic anemia: H&H are labile. Will transfuse if Hb < 7.0     DVT prophylaxis: lovenox  Code Status: full  Family Communication: called pt's wife, William Valentine, no answer so I left a voicemail Disposition Plan: depends on PT/OT recs   Level of care: Telemetry Medical  Status is: Observation The patient remains OBS appropriate and will d/c before 2 midnights.   Consultants:    Procedures:   Antimicrobials:    Subjective: Pt c/o fatigue   Objective: Vitals:   05/25/22 2144 05/25/22 2151 05/26/22 0011 05/26/22 0441  BP: 129/71 (!) 161/82 (!) 158/82 122/69  Pulse: (!) 55 90 78 (!) 58  Resp:    16  Temp:   98.3 F (36.8 C) 97.9 F (36.6 C)  TempSrc:   Oral Oral  SpO2: 100%  100% 99%  Weight:        Intake/Output Summary (Last 24 hours) at 05/26/2022 0747 Last data filed at 05/26/2022 0600 Gross per 24 hour  Intake 500 ml  Output --  Net 500 ml   Filed Weights   05/25/22 1626  Weight: 65.8 kg    Examination:  General exam: Appears calm and comfortable  Respiratory system: Clear to auscultation. Respiratory effort  normal. Cardiovascular system: S1 & S2 +. No rubs, gallops or clicks.  Gastrointestinal system: Abdomen is nondistended, soft and nontender.  Normal bowel sounds heard. Central nervous system: Alert and awake. Moves all extremities  Psychiatry: Judgement and insight appears at baseline. Flat mood and affect     Data Reviewed: I have personally reviewed following labs and imaging studies  CBC: Recent Labs  Lab 05/25/22 1631 05/26/22 0419  WBC 6.1 5.7  NEUTROABS 4.5  --   HGB 11.0* 10.1*  HCT 35.1* 32.0*  MCV 86.9 85.6  PLT 307 419   Basic Metabolic Panel: Recent Labs  Lab 05/25/22 1631 05/26/22 0419  NA 149* 143  K 2.9* 3.8  CL 114* 111  CO2 27 27  GLUCOSE 86 91  BUN 44* 32*  CREATININE 1.48* 1.33*  CALCIUM 8.9 8.6*  MG 2.2 2.1  PHOS 2.9  --    GFR: CrCl cannot be calculated (Unknown ideal weight.). Liver Function Tests: Recent Labs  Lab 05/25/22 1631  AST 28  ALT 25  ALKPHOS 58  BILITOT 1.1  PROT 6.9  ALBUMIN 3.7   No results for input(s): "LIPASE", "AMYLASE" in the last 168 hours. No results for input(s): "AMMONIA" in the last 168 hours. Coagulation Profile: Recent Labs  Lab 05/25/22 1631  INR 1.0   Cardiac Enzymes: No results for input(s): "CKTOTAL", "CKMB", "CKMBINDEX", "TROPONINI" in the last 168 hours. BNP (last  3 results) No results for input(s): "PROBNP" in the last 8760 hours. HbA1C: No results for input(s): "HGBA1C" in the last 72 hours. CBG: No results for input(s): "GLUCAP" in the last 168 hours. Lipid Profile: No results for input(s): "CHOL", "HDL", "LDLCALC", "TRIG", "CHOLHDL", "LDLDIRECT" in the last 72 hours. Thyroid Function Tests: No results for input(s): "TSH", "T4TOTAL", "FREET4", "T3FREE", "THYROIDAB" in the last 72 hours. Anemia Panel: No results for input(s): "VITAMINB12", "FOLATE", "FERRITIN", "TIBC", "IRON", "RETICCTPCT" in the last 72 hours. Sepsis Labs: Recent Labs  Lab 05/25/22 1735 05/25/22 2106  LATICACIDVEN  1.9 1.7    No results found for this or any previous visit (from the past 240 hour(s)).       Radiology Studies: MR BRAIN WO CONTRAST  Result Date: 05/26/2022 CLINICAL DATA:  Initial evaluation for acute seizure like activity. EXAM: MRI HEAD WITHOUT CONTRAST TECHNIQUE: Multiplanar, multiecho pulse sequences of the brain and surrounding structures were obtained without intravenous contrast. COMPARISON:  Prior head CT from earlier the same day. FINDINGS: Brain: Examination mildly degraded by motion artifact. Diffuse prominence of the CSF and spaces compatible generalized cerebral atrophy. Scattered patchy T2/FLAIR hyperintensity involving the periventricular white matter and pons, most consistent with chronic small vessel ischemic disease, mild for age. No abnormal foci of restricted diffusion to suggest acute or subacute ischemia or changes related to seizure. Gray-white matter differentiation maintained. No areas of chronic cortical infarction. No acute or chronic intracranial blood products. No mass lesion, midline shift or mass effect. No hydrocephalus. No extra-axial fluid collection. Pituitary gland and suprasellar region within normal limits. Asymmetric atrophy involving the mesial left temporal lobe/left hippocampal formation (series 15, image 21). No convincing signal abnormality seen. Given the provided history of seizure, findings could reflect changes of mesial temporal sclerosis. Vascular: Major intracranial vascular flow voids are grossly maintained on this motion degraded exam. Skull and upper cervical spine: Craniocervical junction with normal limits. Bone marrow signal intensity grossly within normal limits. No scalp soft tissue abnormality. Sinuses/Orbits: Globes and orbital soft tissues demonstrate no acute finding. Paranasal sinuses are largely clear. No mastoid effusion. Other: None. IMPRESSION: 1. Asymmetric atrophy involving the mesial left temporal lobe/left hippocampal formation.  While this finding may be related to patient's advanced age and cerebral atrophy, possible changes of mesial temporal sclerosis could be considered given the provided history of seizure. Correlation with EEG recommended. 2. No other acute intracranial abnormality. 3. Underlying age-related cerebral atrophy with mild chronic small vessel ischemic disease. Electronically Signed   By: Jeannine Boga M.D.   On: 05/26/2022 03:06   CT Cervical Spine Wo Contrast  Result Date: 05/25/2022 CLINICAL DATA:  Trauma, seizures EXAM: CT CERVICAL SPINE WITHOUT CONTRAST TECHNIQUE: Multidetector CT imaging of the cervical spine was performed without intravenous contrast. Multiplanar CT image reconstructions were also generated. RADIATION DOSE REDUCTION: This exam was performed according to the departmental dose-optimization program which includes automated exposure control, adjustment of the mA and/or kV according to patient size and/or use of iterative reconstruction technique. COMPARISON:  09/20/2021 FINDINGS: Alignment: Reversal of lordosis may be due to muscle spasm to positioning. Alignment of the posterior margins of vertebral bodies is otherwise unremarkable. Skull base and vertebrae: No recent fracture is seen. Soft tissues and spinal canal: There is spinal stenosis at multiple levels, more so at C6-C7 level caused by bony spurs and bulging of the annulus. Disc levels: There is encroachment of neural foramina from C3 to T2 levels. Upper chest: Unremarkable. Other: Scattered arterial calcifications are seen. IMPRESSION: No  recent fracture is seen in cervical spine. 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 There is encroachment of neural foramina from C3-T2 levels. Electronically Signed   By: Elmer Picker M.D.   On: 05/25/2022 18:17   DG Chest Port 1 View  Result Date: 05/25/2022 CLINICAL DATA:  Questionable sepsis. EXAM: PORTABLE CHEST 1 VIEW COMPARISON:  None Available. FINDINGS: The heart  size and mediastinal contours are within normal limits. Both lungs are clear. The visualized skeletal structures are unremarkable. IMPRESSION: No active disease. Electronically Signed   By: Fidela Salisbury M.D.   On: 05/25/2022 17:13   CT HEAD WO CONTRAST (5MM)  Result Date: 05/25/2022 CLINICAL DATA:  Mental status change, unknown cause. EXAM: CT HEAD WITHOUT CONTRAST TECHNIQUE: Contiguous axial images were obtained from the base of the skull through the vertex without intravenous contrast. RADIATION DOSE REDUCTION: This exam was performed according to the departmental dose-optimization program which includes automated exposure control, adjustment of the mA and/or kV according to patient size and/or use of iterative reconstruction technique. COMPARISON:  CT head and cervical spine 09/20/2021 FINDINGS: Brain: There is no evidence of an acute infarct, intracranial hemorrhage, mass, midline shift, or extra-axial fluid collection. Mild cerebral atrophy is within normal limits for age. A cavum septum pellucidum et vergae is noted, a normal variant. Hypodensities in the cerebral white matter bilaterally are unchanged and nonspecific but compatible with mild chronic small vessel ischemic disease. Vascular: Calcified atherosclerosis at the skull base. No hyperdense vessel. Skull: Mild widening of the atlantodental interval which is new from the prior cervical spine CT. No skull fracture. Sinuses/Orbits: Visualized paranasal sinuses and mastoid air cells are clear. Postoperative changes to the globes. Other: None. IMPRESSION: 1. No evidence of acute intracranial abnormality. 2. Mild chronic small vessel ischemic disease. 3. New atlantodental widening which could be degenerative or traumatic. Correlate for any history of recent trauma and consider cervical spine CT for further evaluation if clinically warranted. Electronically Signed   By: Logan Bores M.D.   On: 05/25/2022 17:08        Scheduled Meds:   atorvastatin  20 mg Oral Daily   enoxaparin (LOVENOX) injection  40 mg Subcutaneous QHS   sodium chloride flush  3 mL Intravenous Q12H   Continuous Infusions:   LOS: 0 days    Time spent: 35 mins     Wyvonnia Dusky, MD [facility] Pager [phone]  If 7PM-7AM, please contact night-coverage [email or web address] 05/26/2022, 7:47 AM

## 2022-05-26 NOTE — Evaluation (Signed)
Occupational Therapy Evaluation Patient Details Name: William Valentine MRN: [id number] DOB: Nov 12, 1940 Today's Date: 05/26/2022   History of Present Illness [name] is a 81 year old male with history of dementia, hypertension, insomnia, who presents emergency department for chief concerns of altered mental status.   Clinical Impression   Mr William Valentine was seen for OT evaluation this date. Prior to hospital admission, pt required SUPERVISION + quad cane for all mobility and ADLs, history of falling. Pt lives with spouse, who is caregiver, in home c 5 STE. Pt presents to acute OT demonstrating impaired ADL performance and functional mobility 2/2 decreased activity tolerance and functional strength/ROM/balance deficits. Pt currently requires cues to locate QC at bedside prior to standing. Initial MIN A sit<>stand - assist to correct posterior LOB. Improves to CGA with step by step cues for navigation. MIN A don/doff gown in standing. Left in chair all needs in reach, son at bedside. Pt would benefit from skilled OT to address noted impairments and functional limitations (see below for any additional details). Upon hospital discharge, recommend STR to maximize pt safety and return to PLOF.   Recommendations for follow up therapy are one component of a multi-disciplinary discharge planning process, led by the attending physician.  Recommendations may be updated based on patient status, additional functional criteria and insurance authorization.   Follow Up Recommendations  Skilled nursing-short term rehab (<3 hours/day)    Assistance Recommended at Discharge Frequent or constant Supervision/Assistance  Patient can return home with the following A lot of help with walking and/or transfers;A lot of help with bathing/dressing/bathroom    Functional Status Assessment  Patient has had a recent decline in their functional status and demonstrates the ability to make significant improvements in function  in a reasonable and predictable amount of time.  Equipment Recommendations  BSC/3in1    Recommendations for Other Services       Precautions / Restrictions Precautions Precautions: Fall Restrictions Weight Bearing Restrictions: No      Mobility Bed Mobility Overal bed mobility: Needs Assistance Bed Mobility: Supine to Sit     Supine to sit: Min assist          Transfers Overall transfer level: Needs assistance Equipment used: Quad cane Transfers: Sit to/from Stand Sit to Stand: Min assist           General transfer comment: posterior LOB, improves on second attempt      Balance Overall balance assessment: Needs assistance Sitting-balance support: No upper extremity supported, Feet supported Sitting balance-Leahy Scale: Good     Standing balance support: No upper extremity supported, During functional activity Standing balance-Leahy Scale: Fair                             ADL either performed or assessed with clinical judgement   ADL Overall ADL's : Needs assistance/impaired                                       General ADL Comments: CGA + QC for simulated toilet t/f - MIN A to correct posterior LOB. MIN A don/doff gown in standing.      Pertinent Vitals/Pain Pain Assessment Pain Assessment: No/denies pain     Hand Dominance Right   Extremity/Trunk Assessment Upper Extremity Assessment Upper Extremity Assessment: Overall WFL for tasks assessed   Lower Extremity Assessment Lower Extremity Assessment: Generalized  weakness       Communication Communication Communication: HOH   Cognition Arousal/Alertness: Awake/alert Behavior During Therapy: WFL for tasks assessed/performed Overall Cognitive Status: History of cognitive impairments - at baseline                                        [place] expects to be discharged to:: Private residence Living Arrangements:  Spouse/significant other Available Help at Discharge: Family;Available PRN/intermittently Type of Home: House Home Access: Stairs to enter [employer] of Steps: 5 Entrance Stairs-Rails: Left Home Layout: One level     Bathroom Shower/Tub: Tub/shower unit         Home Equipment: Cane - quad   Additional Comments: wife is caregiver - provides cues but unable to physically assist      Prior Functioning/Environment Prior Level of Function : History of Falls (last six months)             Mobility Comments: SUPERVISION for mobility using QC - cues 2/2 glaucoma. 2 recent falls when walking in grass without supervision ADLs Comments: Cues for all ADLs        OT Problem List: Decreased strength;Decreased activity tolerance;Impaired balance (sitting and/or standing);Decreased safety awareness      OT Treatment/Interventions: Self-care/ADL training;Therapeutic exercise;DME and/or AE instruction;Energy conservation;Therapeutic activities;Balance training;Patient/family education;Visual/perceptual remediation/compensation    OT Goals(Current goals can be found in the care plan section) Acute Rehab OT Goals Patient Stated Goal: to get better OT Goal Formulation: With patient/family Time For Goal Achievement: 06/09/22 Potential to Achieve Goals: Good ADL Goals Pt Will Perform Grooming: with modified independence;standing;with caregiver independent in assisting Pt Will Perform Lower Body Dressing: with modified independence;sit to/from stand;with caregiver independent in assisting Pt Will Transfer to Toilet: with modified independence;ambulating;regular height toilet  OT Frequency: Min 2X/week    Co-evaluation              AM-PAC OT "6 Clicks" Daily Activity     Outcome Measure Help from another person eating meals?: A Little Help from another person taking care of personal grooming?: A Little Help from another person toileting, which includes using toliet,  bedpan, or urinal?: A Lot Help from another person bathing (including washing, rinsing, drying)?: A Lot Help from another person to put on and taking off regular upper body clothing?: A Little Help from another person to put on and taking off regular lower body clothing?: A Little 6 Click Score: 16   End of Session    Activity Tolerance: Patient tolerated treatment well Patient left: with call bell/phone within reach;in chair;with chair alarm set;with family/visitor present  OT Visit Diagnosis: Muscle weakness (generalized) (M62.81);History of falling (Z91.81)                Time: 7169-6789 OT Time Calculation (min): 17 min Charges:  OT General Charges $OT Visit: 1 Visit OT Evaluation $OT Eval Moderate Complexity: 1 Mod  Dessie Coma, M.S. OTR/L  05/26/22, 12:41 PM  ascom [phone]

## 2022-05-26 NOTE — Procedures (Signed)
History: 81 yo M being evaluated for possible seizure  Sedation: none  Technique: This EEG was acquired with electrodes placed according to the International 10-20 electrode system (including Fp1, Fp2, F3, F4, C3, C4, P3, P4, O1, O2, T3, T4, T5, T6, A1, A2, Fz, Cz, Pz). The following electrodes were missing or displaced: none.   Background: The background consists of intermixed alpha and beta activities. There is a well defined posterior dominant rhythm of 10 Hz that attenuates with eye opening. Sleep is recorded with normal appearing structures.  In addition, there is mild intrusion into the waking background of generalized irregular delta range activity.  Photic stimulation: Physiologic driving is present  EEG Abnormalities: 1) mild generalized irregular slow activity  Clinical Interpretation: This EEG is consistent with a mild generalized nonspecific cerebral dysfunction (encephalopathy). There was no seizure or seizure predisposition recorded on this study. Please note that lack of epileptiform activity on EEG does not preclude the possibility of epilepsy.   Roland Rack, MD [facility] [phone]  If 7pm- 7am, please page neurology on call as listed in [place].

## 2022-05-26 NOTE — Progress Notes (Signed)
Eeg done 

## 2022-05-26 NOTE — Evaluation (Signed)
Physical Therapy Evaluation Patient Details Name: William Valentine MRN: [id number] DOB: 17-Jun-1941 Today's Date: 05/26/2022  History of Present Illness  [name] is a 81 year old male with history of dementia, hypertension, insomnia, who presents emergency department for chief concerns of altered mental status.  Clinical Impression  Pt is a pleasant 81 year old male who was admitted for syncope along with seizures and AMS. Pt performs transfers and ambulation with min assist and QC. Pt demonstrates deficits with strength/balance/mobility. Pt with history of dementia and confused at baseline, however able to participate and follow commands. Pt in middle of eating lunch therefore abbreviated evaluation performed. Family reports he had ambulated with staff earlier this date and was unsteady. Recommend trial of RW for safety. Currently not at baseline level. Would benefit from skilled PT to address above deficits and promote optimal return to PLOF; recommend transition to STR upon discharge from acute hospitalization.      Recommendations for follow up therapy are one component of a multi-disciplinary discharge planning process, led by the attending physician.  Recommendations may be updated based on patient status, additional functional criteria and insurance authorization.  Follow Up Recommendations Skilled nursing-short term rehab (<3 hours/day) Can patient physically be transported by private vehicle: Yes    Assistance Recommended at Discharge Frequent or constant Supervision/Assistance  Patient can return home with the following  A little help with walking and/or transfers;A little help with bathing/dressing/bathroom;Help with stairs or ramp for entrance    Equipment Recommendations Rolling walker (2 wheels)  Recommendations for Other Services       Functional Status Assessment Patient has had a recent decline in their functional status and demonstrates the ability to make  significant improvements in function in a reasonable and predictable amount of time.     Precautions / Restrictions Precautions Precautions: Fall Restrictions Weight Bearing Restrictions: No      Mobility  Bed Mobility               General bed mobility comments: NT, received in recliner.    Transfers Overall transfer level: Needs assistance Equipment used: Quad cane Transfers: Sit to/from Stand Sit to Stand: Min assist           General transfer comment: needs assist for transfer.Once standing, braces B LE against bed due to post LOB. WOuld recommend RW trial for further attempts    Ambulation/Gait Ambulation/Gait assistance: Min assist Gait Distance (Feet): 3 Feet Assistive device: Quad cane Gait Pattern/deviations: Step-to pattern       General Gait Details: pt eating lunch at this time, able to march in place and take a few steps in forward/backward direction, however needs min assist for balance. Use of QC  Stairs            Wheelchair Mobility    Modified Rankin (Stroke Patients Only)       Balance Overall balance assessment: Needs assistance Sitting-balance support: No upper extremity supported, Feet supported Sitting balance-Leahy Scale: Good     Standing balance support: Single extremity supported Standing balance-Leahy Scale: Fair                               Pertinent Vitals/Pain Pain Assessment Pain Assessment: No/denies pain    Home Living Family/patient expects to be discharged to:: Private residence Living Arrangements: Spouse/significant other Available Help at Discharge: Family;Available PRN/intermittently Type of Home: House Home Access: Stairs to enter Entrance Stairs-Rails: Left Entrance Stairs-Number  of Steps: 5   Home Layout: One level Home Equipment: Cane - quad Additional Comments: wife is caregiver - provides cues but unable to physically assist    Prior Function Prior Level of Function :  History of Falls (last six months)             Mobility Comments: SUPERVISION for mobility using QC - cues 2/2 glaucoma. 2 recent falls when walking in grass without supervision ADLs Comments: Cues for all ADLs     Hand Dominance   Dominant Hand: Right    Extremity/Trunk Assessment   Upper Extremity Assessment Upper Extremity Assessment: Generalized weakness (B UE grossly 4/5)    Lower Extremity Assessment Lower Extremity Assessment: Generalized weakness (B LE grossly 3/5)       Communication   Communication: HOH  Cognition Arousal/Alertness: Awake/alert Behavior During Therapy: WFL for tasks assessed/performed Overall Cognitive Status: History of cognitive impairments - at baseline                                 General Comments: pt eating lunch, unaware he is in hospital. Alert to self/family present        General Comments      Exercises     Assessment/Plan    PT Assessment Patient needs continued PT services  PT Problem List Decreased strength;Decreased activity tolerance;Decreased balance;Decreased mobility;Decreased cognition       PT Treatment Interventions Gait training;DME instruction;Therapeutic exercise;Balance training    PT Goals (Current goals can be found in the Care Plan section)  Acute Rehab PT Goals Patient Stated Goal: unable to state due to confusion PT Goal Formulation: With family Time For Goal Achievement: 06/09/22 Potential to Achieve Goals: Fair    Frequency Min 2X/week     Co-evaluation               AM-PAC PT "6 Clicks" Mobility  Outcome Measure Help needed turning from your back to your side while in a flat bed without using bedrails?: A Little Help needed moving from lying on your back to sitting on the side of a flat bed without using bedrails?: A Little Help needed moving to and from a bed to a chair (including a wheelchair)?: A Little Help needed standing up from a chair using your arms (e.g.,  wheelchair or bedside chair)?: A Little Help needed to walk in hospital room?: A Lot Help needed climbing 3-5 steps with a railing? : A Lot 6 Click Score: 16    End of Session   Activity Tolerance: Patient tolerated treatment well Patient left: in chair;with chair alarm set;with family/visitor present Nurse Communication: Mobility status PT Visit Diagnosis: Muscle weakness (generalized) (M62.81);Difficulty in walking, not elsewhere classified (R26.2);Unsteadiness on feet (R26.81);Repeated falls (R29.6)    Time: 0938-1829 PT Time Calculation (min) (ACUTE ONLY): 10 min   Charges:   PT Evaluation $PT Eval Low Complexity: 1 Low          Greggory Stallion, PT, DPT, GCS [phone]   Shaiden Aldous 05/26/2022, 1:46 PM

## 2022-05-27 DIAGNOSIS — I10 Essential (primary) hypertension: Secondary | ICD-10-CM | POA: Diagnosis not present

## 2022-05-27 DIAGNOSIS — F0390 Unspecified dementia without behavioral disturbance: Secondary | ICD-10-CM | POA: Diagnosis not present

## 2022-05-27 DIAGNOSIS — R55 Syncope and collapse: Secondary | ICD-10-CM | POA: Diagnosis not present

## 2022-05-27 LAB — URINALYSIS, COMPLETE (UACMP) WITH MICROSCOPIC
Bacteria, UA: NONE SEEN
Bilirubin Urine: NEGATIVE
Glucose, UA: NEGATIVE mg/dL
Hgb urine dipstick: NEGATIVE
Ketones, ur: NEGATIVE mg/dL
Leukocytes,Ua: NEGATIVE
Nitrite: NEGATIVE
Protein, ur: NEGATIVE mg/dL
Specific Gravity, Urine: 1.008 (ref 1.005–1.030)
pH: 7 (ref 5.0–8.0)

## 2022-05-27 LAB — BASIC METABOLIC PANEL
Anion gap: 4 — ABNORMAL LOW (ref 5–15)
BUN: 22 mg/dL (ref 8–23)
CO2: 28 mmol/L (ref 22–32)
Calcium: 8.8 mg/dL — ABNORMAL LOW (ref 8.9–10.3)
Chloride: 108 mmol/L (ref 98–111)
Creatinine, Ser: 0.89 mg/dL (ref 0.61–1.24)
GFR, Estimated: >60 mL/min (ref >60)
Glucose, Bld: 89 mg/dL (ref 70–99)
Potassium: 3.6 mmol/L (ref 3.5–5.1)
Sodium: 140 mmol/L (ref 135–145)

## 2022-05-27 LAB — CBC
HCT: 33.2 % — ABNORMAL LOW (ref 39.0–52.0)
Hemoglobin: 10.7 g/dL — ABNORMAL LOW (ref 13.0–17.0)
MCH: 27.5 pg (ref 26.0–34.0)
MCHC: 32.2 g/dL (ref 30.0–36.0)
MCV: 85.3 fL (ref 80.0–100.0)
Platelets: 261 10*3/uL (ref 150–400)
RBC: 3.89 MIL/uL — ABNORMAL LOW (ref 4.22–5.81)
RDW: 16.2 % — ABNORMAL HIGH (ref 11.5–15.5)
WBC: 4.7 10*3/uL (ref 4.0–10.5)
nRBC: 0 % (ref 0.0–0.2)

## 2022-05-27 NOTE — Care Management (Signed)
Snf recommended.  Patient is no Alert and Oriented fully, RNCM attempted to reach spouse, unable to leave a message, will continue to try

## 2022-05-27 NOTE — Plan of Care (Signed)

## 2022-05-27 NOTE — TOC Initial Note (Addendum)
Transition of Care [facility]) - Initial/Assessment Note    Patient Details  Name: William Valentine MRN: [id number] Date of Birth: 24-Jul-1941  [facility] CM/SW Contact:    Pete Pelt, RN Phone Number: 05/27/2022, 3:23 PM  Clinical Narrative:   RNCM spoke with son at bedside.  Discussed dc plan.  Son agrees with therapy recommendations for skilled nursing facility.  Family does not have preferences, but they would like this for the [place] area.                 Addendum bed search started  Expected Discharge Plan: [place]     Patient Goals and CMS Choice     Choice offered to / list presented to : Adult Children  Expected Discharge Plan and Services Expected Discharge Plan: [place]   Discharge Planning Services: CM Consult   Living arrangements for the past 2 months: Single Family Home                                      Prior Living Arrangements/Services Living arrangements for the past 2 months: Single Family Home Lives with:: Self, Spouse Patient language and need for interpreter reviewed:: Yes (patient not fully alert and oriented spoke with family)        Need for Family Participation in Patient Care: Yes (Comment) Care giver support system in place?: Yes (comment)   Criminal Activity/Legal Involvement Pertinent to Current Situation/Hospitalization: No - Comment as needed  Activities of Daily Living Home Assistive Devices/Equipment: Dentures (specify type), Cane (specify quad or straight) ADL Screening (condition at time of admission) Patient's cognitive ability adequate to safely complete daily activities?: No Is the patient deaf or have difficulty hearing?: Yes Does the patient have difficulty seeing, even when wearing glasses/contacts?: Yes Does the patient have difficulty concentrating, remembering, or making decisions?: Yes Patient able to express need for assistance with ADLs?: Yes Does the patient  have difficulty dressing or bathing?: Yes Independently performs ADLs?: No Communication: Independent Dressing (OT): Needs assistance Is this a change from baseline?: Pre-admission baseline Grooming: Needs assistance Is this a change from baseline?: Pre-admission baseline Feeding: Independent Bathing: Needs assistance Is this a change from baseline?: Pre-admission baseline Toileting: Needs assistance Is this a change from baseline?: Pre-admission baseline In/Out Bed: Independent Walks in Home: Independent Does the patient have difficulty walking or climbing stairs?: Yes Weakness of Legs: Both Weakness of Arms/Hands: None  Permission Sought/Granted                  Emotional Assessment Appearance:: Appears stated age Attitude/Demeanor/Rapport:  (spoke with family)     Alcohol / Substance Use: Not Applicable Psych Involvement: No (comment)  Admission diagnosis:  Syncope [R55] Syncope, unspecified syncope type [R55] Patient Active Problem List   Diagnosis Date Noted   Syncope 05/25/2022   Hypokalemia 05/25/2022   AKI (acute kidney injury) ([place]) 05/25/2022   Essential hypertension 05/25/2022   Dementia without behavioral disturbance ([place]) 05/25/2022   Elevated troponin 05/25/2022   PCP:  Midge Minium, PA Pharmacy:   [id number] [place] [address]Phone: [phone]: [phone]    Social Determinants of Health (SDOH) Interventions    Readmission Risk Interventions     No data to display

## 2022-05-27 NOTE — Progress Notes (Signed)
Physical Therapy Treatment Patient Details Name: William Valentine MRN: [id number] DOB: [birth year] Today's Date: 05/27/2022   History of Present Illness [name] is a 81 year old male with history of dementia, hypertension, insomnia, who presents emergency department for chief concerns of altered mental status.    PT Comments    Pt agreeable to participate in therapy session. His lunch tray arrived soon after session began and he was agreeable to sit up in the recliner chair to eat. He continues to require min A for transfers and min A to ambulate a very short distance. His cognitive deficits were very limiting in regards to his functional mobility today. Pt would continue to benefit from skilled physical therapy services at this time while admitted and after d/c to address the below listed limitations in order to improve overall safety and independence with functional mobility.    Recommendations for follow up therapy are one component of a multi-disciplinary discharge planning process, led by the attending physician.  Recommendations may be updated based on patient status, additional functional criteria and insurance authorization.  Follow Up Recommendations  Skilled nursing-short term rehab (<3 hours/day) Can patient physically be transported by private vehicle: Yes   Assistance Recommended at Discharge Frequent or constant Supervision/Assistance  Patient can return home with the following A little help with walking and/or transfers;A little help with bathing/dressing/bathroom;Help with stairs or ramp for entrance   Equipment Recommendations  Rolling walker (2 wheels)    Recommendations for Other Services       Precautions / Restrictions Precautions Precautions: Fall Restrictions Weight Bearing Restrictions: No     Mobility  Bed Mobility Overal bed mobility: Needs Assistance Bed Mobility: Supine to Sit     Supine to sit: Supervision     General bed mobility  comments: increased time needed    Transfers Overall transfer level: Needs assistance Equipment used: Rolling walker (2 wheels) Transfers: Sit to/from Stand Sit to Stand: Min assist           General transfer comment: min A needed to fully power up into an upright standing position from EOB    Ambulation/Gait Ambulation/Gait assistance: Min assist Gait Distance (Feet): 3 Feet Assistive device: Rolling walker (2 wheels) Gait Pattern/deviations: Step-to pattern, Decreased step length - right, Decreased step length - left, Decreased stride length, Shuffle Gait velocity: decreased     General Gait Details: pt with great difficulty with motor planning and required min A for RW management and almost constant cueing for every step to complete task   Stairs             Wheelchair Mobility    Modified Rankin (Stroke Patients Only)       Balance Overall balance assessment: Needs assistance Sitting-balance support: No upper extremity supported, Feet supported Sitting balance-Leahy Scale: Good     Standing balance support: During functional activity, Bilateral upper extremity supported, Single extremity supported Standing balance-Leahy Scale: Poor                              Cognition Arousal/Alertness: Awake/alert Behavior During Therapy: Flat affect Overall Cognitive Status: History of cognitive impairments - at baseline Area of Impairment: Attention, Memory, Following commands, Safety/judgement, Awareness, Problem solving                   Current Attention Level: Selective Memory: Decreased short-term memory Following Commands: Follows one step commands inconsistently, Follows one step commands with increased time  Safety/Judgement: Decreased awareness of deficits, Decreased awareness of safety Awareness: Intellectual Problem Solving: Slow processing, Decreased initiation, Difficulty sequencing, Requires verbal cues          Exercises       General Comments        Pertinent Vitals/Pain Pain Assessment Pain Assessment: No/denies pain    Home Living                          Prior Function            PT Goals (current goals can now be found in the care plan section) Acute Rehab PT Goals PT Goal Formulation: With patient Time For Goal Achievement: 06/09/22 Potential to Achieve Goals: Fair Progress towards PT goals: Progressing toward goals    Frequency    Min 2X/week      PT Plan Current plan remains appropriate    Co-evaluation              AM-PAC PT "6 Clicks" Mobility   Outcome Measure  Help needed turning from your back to your side while in a flat bed without using bedrails?: None Help needed moving from lying on your back to sitting on the side of a flat bed without using bedrails?: A Little Help needed moving to and from a bed to a chair (including a wheelchair)?: A Little Help needed standing up from a chair using your arms (e.g., wheelchair or bedside chair)?: A Little Help needed to walk in hospital room?: A Lot Help needed climbing 3-5 steps with a railing? : Total 6 Click Score: 16    End of Session Equipment Utilized During Treatment: Gait belt Activity Tolerance: Patient tolerated treatment well Patient left: in chair;with call bell/phone within reach;with chair alarm set Nurse Communication: Mobility status PT Visit Diagnosis: Muscle weakness (generalized) (M62.81);Difficulty in walking, not elsewhere classified (R26.2);Unsteadiness on feet (R26.81);Repeated falls (R29.6)     Time: 1316-1330 PT Time Calculation (min) (ACUTE ONLY): 14 min  Charges:  $Therapeutic Activity: 8-22 mins                     Anastasio Champion, DPT  Acute Rehabilitation Services Office [place] 05/27/2022, 1:48 PM

## 2022-05-27 NOTE — Progress Notes (Signed)
PROGRESS NOTE    William Valentine  FIE:[id number] DOB: 1941-06-19 DOA: 05/25/2022 PCP: Midge Minium, PA    Assessment & Plan:   Principal Problem:   Syncope Active Problems:   Hypokalemia   AKI (acute kidney injury) ([place])   Essential hypertension   Dementia without behavioral disturbance (HCC)   Elevated troponin  Assessment and Plan: Syncope: etiology unclear. EEG shows no seizure or epileptiform activity but mild generalized nonspecific cerebral dysfunction(encephalopathy). Echo shows EF 16-60%, grade I diastolic dysfunction, no regional wall motion abnormalities & no atrial level shunts.  MRI brain shows asymmetric atrophy involving the mesial left temporal lobe/left hippocampal formation & no other acute intracranial abnormality. PT/OT recs SNF   Elevated troponin: likely secondary to demand ischemia. Echo shows EF 63-01%, grade I diastolic dysfunction, no regional wall motion abnormalities, & no atrial shunts detected.    Dementia: continue w/ supportive care    HTN: continue to hold home dose of lisinopril-HCTZ as BP is low normal. Hydralazine prn    AKI: Cr is trending down again today.    Hypokalemia: WNL today   Normocytic anemia: H&H are stable. No need for a transfusion currently     DVT prophylaxis: lovenox  Code Status: full  Family Communication: called pt's wife again today, Secily, no answer so I left a voicemail Disposition Plan: PT/OT recs SNF   Level of care: Telemetry Medical  Status is: Observation The patient remains OBS appropriate and will d/c before 2 midnights.   Consultants:    Procedures:   Antimicrobials:    Subjective: Pt c/o malaise   Objective: Vitals:   05/27/22 0008 05/27/22 0500 05/27/22 0522 05/27/22 0817  BP: (!) 142/73  129/76 116/70  Pulse: 63  (!) 46 67  Resp: '18  16 14  '$ Temp: 98.1 F (36.7 C)  98.1 F (36.7 C) 98.1 F (36.7 C)  TempSrc:      SpO2: 100%  100% 99%  Weight:  63.5 kg      Intake/Output  Summary (Last 24 hours) at 05/27/2022 0824 Last data filed at 05/27/2022 0522 Gross per 24 hour  Intake 240 ml  Output 1600 ml  Net -1360 ml   Filed Weights   05/25/22 1626 05/27/22 0500  Weight: 65.8 kg 63.5 kg    Examination:  General exam: Appears comfortable  Respiratory system: clear breath sounds b/l Cardiovascular system: S1/S2+. No rubs or clicks  Gastrointestinal system: Abd is soft, NT, ND & hypoactive bowel sounds  Central nervous system: Alert and oriented to self only. Moves all extremities  Psychiatry: Judgement and insight appears at baseline. Flat mood and affect    Data Reviewed: I have personally reviewed following labs and imaging studies  CBC: Recent Labs  Lab 05/25/22 1631 05/26/22 0419  WBC 6.1 5.7  NEUTROABS 4.5  --   HGB 11.0* 10.1*  HCT 35.1* 32.0*  MCV 86.9 85.6  PLT 307 601   Basic Metabolic Panel: Recent Labs  Lab 05/25/22 1631 05/26/22 0419  NA 149* 143  K 2.9* 3.8  CL 114* 111  CO2 27 27  GLUCOSE 86 91  BUN 44* 32*  CREATININE 1.48* 1.33*  CALCIUM 8.9 8.6*  MG 2.2 2.1  PHOS 2.9  --    GFR: CrCl cannot be calculated (Unknown ideal weight.). Liver Function Tests: Recent Labs  Lab 05/25/22 1631  AST 28  ALT 25  ALKPHOS 58  BILITOT 1.1  PROT 6.9  ALBUMIN 3.7   No results for input(s): "LIPASE", "  AMYLASE" in the last 168 hours. No results for input(s): "AMMONIA" in the last 168 hours. Coagulation Profile: Recent Labs  Lab 05/25/22 1631  INR 1.0   Cardiac Enzymes: No results for input(s): "CKTOTAL", "CKMB", "CKMBINDEX", "TROPONINI" in the last 168 hours. BNP (last 3 results) No results for input(s): "PROBNP" in the last 8760 hours. HbA1C: No results for input(s): "HGBA1C" in the last 72 hours. CBG: No results for input(s): "GLUCAP" in the last 168 hours. Lipid Profile: No results for input(s): "CHOL", "HDL", "LDLCALC", "TRIG", "CHOLHDL", "LDLDIRECT" in the last 72 hours. Thyroid Function Tests: No results for  input(s): "TSH", "T4TOTAL", "FREET4", "T3FREE", "THYROIDAB" in the last 72 hours. Anemia Panel: No results for input(s): "VITAMINB12", "FOLATE", "FERRITIN", "TIBC", "IRON", "RETICCTPCT" in the last 72 hours. Sepsis Labs: Recent Labs  Lab 05/25/22 1735 05/25/22 2106  LATICACIDVEN 1.9 1.7    No results found for this or any previous visit (from the past 240 hour(s)).       Radiology Studies: EEG adult  Result Date: Jun 05, 2022 Greta Doom, MD     06/05/22  4:06 PM History: 81 yo M being evaluated for possible seizure Sedation: none Technique: This EEG was acquired with electrodes placed according to the International 10-20 electrode system (including Fp1, Fp2, F3, F4, C3, C4, P3, P4, O1, O2, T3, T4, T5, T6, A1, A2, Fz, Cz, Pz). The following electrodes were missing or displaced: none. Background: The background consists of intermixed alpha and beta activities. There is a well defined posterior dominant rhythm of 10 Hz that attenuates with eye opening. Sleep is recorded with normal appearing structures.  In addition, there is mild intrusion into the waking background of generalized irregular delta range activity. Photic stimulation: Physiologic driving is present EEG Abnormalities: 1) mild generalized irregular slow activity Clinical Interpretation: This EEG is consistent with a mild generalized nonspecific cerebral dysfunction (encephalopathy). There was no seizure or seizure predisposition recorded on this study. Please note that lack of epileptiform activity on EEG does not preclude the possibility of epilepsy. Roland Rack, MD [facility] [phone] If 7pm- 7am, please page neurology on call as listed in [place].   ECHOCARDIOGRAM COMPLETE  Result Date: 05-Jun-2022    ECHOCARDIOGRAM REPORT   Patient Name:   William Valentine Date of Exam: 05/25/2022 Medical Rec #:  [id number]       Height:       71.0 in Accession #:    [id number]      Weight:       145.0 lb Date of  Birth:  [birth year]       BSA:          1.839 m Patient Age:    81 years        BP:           141/72 mmHg Patient Gender: M               HR:           72 bpm. Exam Location:  [facility] Procedure: 2D Echo, Cardiac Doppler and Color Doppler Indications:     R55 Syncope  History:         Patient has no prior history of Echocardiogram examinations.                  Risk Factors:Diabetes and Dyslipidemia. Dementia.  Sonographer:     Heinrich Fertig RDCS Referring Phys:  [id number] AMY N COX Diagnosing Phys: Kathlyn Sacramento MD IMPRESSIONS  1. Left ventricular ejection fraction,  by estimation, is 55 to 60%. The left ventricle has normal function. The left ventricle has no regional wall motion abnormalities. There is moderate asymmetric left ventricular hypertrophy of the basal-septal segment. Left ventricular diastolic parameters are consistent with Grade I diastolic dysfunction (impaired relaxation).  2. Right ventricular systolic function is normal. The right ventricular size is normal. There is normal pulmonary artery systolic pressure.  3. The mitral valve is myxomatous. Mild mitral valve regurgitation. No evidence of mitral stenosis.  4. Tricuspid valve regurgitation is moderate.  5. The aortic valve is normal in structure. Aortic valve regurgitation is mild. Aortic valve sclerosis/calcification is present, without any evidence of aortic stenosis. FINDINGS  Left Ventricle: Left ventricular ejection fraction, by estimation, is 55 to 60%. The left ventricle has normal function. The left ventricle has no regional wall motion abnormalities. The left ventricular internal cavity size was normal in size. There is  moderate asymmetric left ventricular hypertrophy of the basal-septal segment. Left ventricular diastolic parameters are consistent with Grade I diastolic dysfunction (impaired relaxation). Right Ventricle: The right ventricular size is normal. No increase in right ventricular wall thickness. Right ventricular  systolic function is normal. There is normal pulmonary artery systolic pressure. The tricuspid regurgitant velocity is 2.38 m/s, and  with an assumed right atrial pressure of 5 mmHg, the estimated right ventricular systolic pressure is 32.4 mmHg. Left Atrium: Left atrial size was normal in size. Right Atrium: Right atrial size was normal in size. Pericardium: There is no evidence of pericardial effusion. Mitral Valve: The mitral valve is myxomatous. There is moderate thickening of the mitral valve leaflet(s). Mild mitral valve regurgitation. No evidence of mitral valve stenosis. Tricuspid Valve: The tricuspid valve is normal in structure. Tricuspid valve regurgitation is moderate . No evidence of tricuspid stenosis. Aortic Valve: The aortic valve is normal in structure. Aortic valve regurgitation is mild. Aortic valve sclerosis/calcification is present, without any evidence of aortic stenosis. Pulmonic Valve: The pulmonic valve was normal in structure. Pulmonic valve regurgitation is mild. No evidence of pulmonic stenosis. Aorta: The aortic root is normal in size and structure. Venous: The inferior vena cava was not well visualized. IAS/Shunts: No atrial level shunt detected by color flow Doppler.  LEFT VENTRICLE PLAX 2D LVIDd:         3.70 cm   Diastology LVIDs:         2.60 cm   LV e' medial:    5.87 cm/s LV PW:         1.10 cm   LV E/e' medial:  11.3 LV IVS:        1.10 cm   LV e' lateral:   7.40 cm/s LVOT diam:     2.10 cm   LV E/e' lateral: 8.9 LV SV:         68 LV SV Index:   37 LVOT Area:     3.46 cm  RIGHT VENTRICLE RV Basal diam:  3.80 cm RV S prime:     13.65 cm/s TAPSE (M-mode): 1.8 cm LEFT ATRIUM             Index        RIGHT ATRIUM           Index LA diam:        3.30 cm 1.79 cm/m   RA Area:     13.90 cm LA Vol (A2C):   30.2 ml 16.42 ml/m  RA Volume:   41.20 ml  22.40 ml/m LA Vol (A4C):  34.9 ml 18.98 ml/m LA Biplane Vol: 34.1 ml 18.54 ml/m  AORTIC VALVE LVOT Vmax:   90.00 cm/s LVOT Vmean:   61.600 cm/s LVOT VTI:    0.195 m  AORTA Ao Root diam: 4.10 cm Ao Asc diam:  3.80 cm MITRAL VALVE               TRICUSPID VALVE MV Area (PHT): 2.29 cm    TR Peak grad:   22.7 mmHg MV Decel Time: 331 msec    TR Vmax:        238.00 cm/s MV E velocity: 66.20 cm/s MV A velocity: 94.10 cm/s  SHUNTS MV E/A ratio:  0.70        Systemic VTI:  0.20 m                            Systemic Diam: 2.10 cm Kathlyn Sacramento MD Electronically signed by Kathlyn Sacramento MD Signature Date/Time: 05/26/2022/9:34:25 AM    Final    MR BRAIN WO CONTRAST  Result Date: 05/26/2022 CLINICAL DATA:  Initial evaluation for acute seizure like activity. EXAM: MRI HEAD WITHOUT CONTRAST TECHNIQUE: Multiplanar, multiecho pulse sequences of the brain and surrounding structures were obtained without intravenous contrast. COMPARISON:  Prior head CT from earlier the same day. FINDINGS: Brain: Examination mildly degraded by motion artifact. Diffuse prominence of the CSF and spaces compatible generalized cerebral atrophy. Scattered patchy T2/FLAIR hyperintensity involving the periventricular white matter and pons, most consistent with chronic small vessel ischemic disease, mild for age. No abnormal foci of restricted diffusion to suggest acute or subacute ischemia or changes related to seizure. Gray-white matter differentiation maintained. No areas of chronic cortical infarction. No acute or chronic intracranial blood products. No mass lesion, midline shift or mass effect. No hydrocephalus. No extra-axial fluid collection. Pituitary gland and suprasellar region within normal limits. Asymmetric atrophy involving the mesial left temporal lobe/left hippocampal formation (series 15, image 21). No convincing signal abnormality seen. Given the provided history of seizure, findings could reflect changes of mesial temporal sclerosis. Vascular: Major intracranial vascular flow voids are grossly maintained on this motion degraded exam. Skull and upper cervical spine:  Craniocervical junction with normal limits. Bone marrow signal intensity grossly within normal limits. No scalp soft tissue abnormality. Sinuses/Orbits: Globes and orbital soft tissues demonstrate no acute finding. Paranasal sinuses are largely clear. No mastoid effusion. Other: None. IMPRESSION: 1. Asymmetric atrophy involving the mesial left temporal lobe/left hippocampal formation. While this finding may be related to patient's advanced age and cerebral atrophy, possible changes of mesial temporal sclerosis could be considered given the provided history of seizure. Correlation with EEG recommended. 2. No other acute intracranial abnormality. 3. Underlying age-related cerebral atrophy with mild chronic small vessel ischemic disease. Electronically Signed   By: Jeannine Boga M.D.   On: 05/26/2022 03:06   CT Cervical Spine Wo Contrast  Result Date: 05/25/2022 CLINICAL DATA:  Trauma, seizures EXAM: CT CERVICAL SPINE WITHOUT CONTRAST TECHNIQUE: Multidetector CT imaging of the cervical spine was performed without intravenous contrast. Multiplanar CT image reconstructions were also generated. RADIATION DOSE REDUCTION: This exam was performed according to the departmental dose-optimization program which includes automated exposure control, adjustment of the mA and/or kV according to patient size and/or use of iterative reconstruction technique. COMPARISON:  09/20/2021 FINDINGS: Alignment: Reversal of lordosis may be due to muscle spasm to positioning. Alignment of the posterior margins of vertebral bodies is otherwise unremarkable. Skull base and vertebrae: No recent  fracture is seen. Soft tissues and spinal canal: There is spinal stenosis at multiple levels, more so at C6-C7 level caused by bony spurs and bulging of the annulus. Disc levels: There is encroachment of neural foramina from C3 to T2 levels. Upper chest: Unremarkable. Other: Scattered arterial calcifications are seen. IMPRESSION: No recent fracture  is seen in cervical spine. 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 There is encroachment of neural foramina from C3-T2 levels. Electronically Signed   By: Elmer Picker M.D.   On: 05/25/2022 18:17   DG Chest Port 1 View  Result Date: 05/25/2022 CLINICAL DATA:  Questionable sepsis. EXAM: PORTABLE CHEST 1 VIEW COMPARISON:  None Available. FINDINGS: The heart size and mediastinal contours are within normal limits. Both lungs are clear. The visualized skeletal structures are unremarkable. IMPRESSION: No active disease. Electronically Signed   By: Fidela Salisbury M.D.   On: 05/25/2022 17:13   CT HEAD WO CONTRAST (5MM)  Result Date: 05/25/2022 CLINICAL DATA:  Mental status change, unknown cause. EXAM: CT HEAD WITHOUT CONTRAST TECHNIQUE: Contiguous axial images were obtained from the base of the skull through the vertex without intravenous contrast. RADIATION DOSE REDUCTION: This exam was performed according to the departmental dose-optimization program which includes automated exposure control, adjustment of the mA and/or kV according to patient size and/or use of iterative reconstruction technique. COMPARISON:  CT head and cervical spine 09/20/2021 FINDINGS: Brain: There is no evidence of an acute infarct, intracranial hemorrhage, mass, midline shift, or extra-axial fluid collection. Mild cerebral atrophy is within normal limits for age. A cavum septum pellucidum et vergae is noted, a normal variant. Hypodensities in the cerebral white matter bilaterally are unchanged and nonspecific but compatible with mild chronic small vessel ischemic disease. Vascular: Calcified atherosclerosis at the skull base. No hyperdense vessel. Skull: Mild widening of the atlantodental interval which is new from the prior cervical spine CT. No skull fracture. Sinuses/Orbits: Visualized paranasal sinuses and mastoid air cells are clear. Postoperative changes to the globes. Other: None. IMPRESSION: 1. No  evidence of acute intracranial abnormality. 2. Mild chronic small vessel ischemic disease. 3. New atlantodental widening which could be degenerative or traumatic. Correlate for any history of recent trauma and consider cervical spine CT for further evaluation if clinically warranted. Electronically Signed   By: Logan Bores M.D.   On: 05/25/2022 17:08        Scheduled Meds:  atorvastatin  20 mg Oral Daily   enoxaparin (LOVENOX) injection  40 mg Subcutaneous QHS   sodium chloride flush  3 mL Intravenous Q12H   Continuous Infusions:   LOS: 0 days    Time spent: 30 mins     Wyvonnia Dusky, MD [facility] Pager [phone]  If 7PM-7AM, please contact night-coverage [email or web address] 05/27/2022, 8:24 AM

## 2022-05-28 DIAGNOSIS — R55 Syncope and collapse: Secondary | ICD-10-CM | POA: Diagnosis not present

## 2022-05-28 DIAGNOSIS — F0390 Unspecified dementia without behavioral disturbance: Secondary | ICD-10-CM | POA: Diagnosis not present

## 2022-05-28 DIAGNOSIS — I10 Essential (primary) hypertension: Secondary | ICD-10-CM | POA: Diagnosis not present

## 2022-05-28 LAB — CBC
HCT: 32.9 % — ABNORMAL LOW (ref 39.0–52.0)
Hemoglobin: 10.6 g/dL — ABNORMAL LOW (ref 13.0–17.0)
MCH: 27 pg (ref 26.0–34.0)
MCHC: 32.2 g/dL (ref 30.0–36.0)
MCV: 83.7 fL (ref 80.0–100.0)
Platelets: 242 10*3/uL (ref 150–400)
RBC: 3.93 MIL/uL — ABNORMAL LOW (ref 4.22–5.81)
RDW: 16 % — ABNORMAL HIGH (ref 11.5–15.5)
WBC: 5.7 10*3/uL (ref 4.0–10.5)
nRBC: 0 % (ref 0.0–0.2)

## 2022-05-28 LAB — BASIC METABOLIC PANEL
Anion gap: 5 (ref 5–15)
BUN: 22 mg/dL (ref 8–23)
CO2: 27 mmol/L (ref 22–32)
Calcium: 8.8 mg/dL — ABNORMAL LOW (ref 8.9–10.3)
Chloride: 107 mmol/L (ref 98–111)
Creatinine, Ser: 0.92 mg/dL (ref 0.61–1.24)
GFR, Estimated: >60 mL/min (ref >60)
Glucose, Bld: 83 mg/dL (ref 70–99)
Potassium: 3.5 mmol/L (ref 3.5–5.1)
Sodium: 139 mmol/L (ref 135–145)

## 2022-05-28 LAB — GLUCOSE, CAPILLARY: Glucose-Capillary: 91 mg/dL (ref 70–99)

## 2022-05-28 MED ORDER — IRBESARTAN 75 MG PO TABS
37.5000 mg | ORAL_TABLET | Freq: Every day | ORAL | Status: DC
  Administered 2022-05-28 – 2022-06-01 (×5): 37.5 mg via ORAL
  Filled 2022-05-28 (×5): qty 0.5

## 2022-05-28 NOTE — NC FL2 (Signed)
[place] LEVEL OF CARE SCREENING TOOL     IDENTIFICATION  Patient Name: William Valentine Birthdate: 02-17-1941 Sex: male Admission Date (Current Location): 05/25/2022  [facility] and [place] Number:  Engineering geologist and Address:  [facility], [address] [place], [place] [address]      Provider Number: [id number]  Attending Physician Name and Address:  Wyvonnia Dusky, MD  Relative Name and Phone Number:  MIKAH, POSS (Spouse)   [phone] [employer] Phone)    Current Level of Care: Hospital Recommended Level of Care: [place] Prior Approval Number:    Date Approved/Denied:   PASRR Number:    Discharge Plan: SNF    Current Diagnoses: Patient Active Problem List   Diagnosis Date Noted   Syncope 05/25/2022   Hypokalemia 05/25/2022   AKI (acute kidney injury) ([place]) 05/25/2022   Essential hypertension 05/25/2022   Dementia without behavioral disturbance ([place]) 05/25/2022   Elevated troponin 05/25/2022    Orientation RESPIRATION BLADDER Height & Weight     Self (forgetful)  Normal (100% room air) Incontinent, External catheter Weight: 140 lb 14 oz (63.9 kg) Height:     BEHAVIORAL SYMPTOMS/MOOD NEUROLOGICAL BOWEL NUTRITION STATUS      Continent Diet (Heart)  AMBULATORY STATUS COMMUNICATION OF NEEDS Skin   Limited Assist Verbally Normal                       Personal Care Assistance Level of Assistance  Bathing, Feeding, Dressing Bathing Assistance: Limited assistance Feeding assistance: Limited assistance Dressing Assistance: Limited assistance     Functional Limitations Info  Sight, Hearing, Speech Sight Info: Impaired Hearing Info: Impaired Speech Info: Adequate    SPECIAL CARE FACTORS FREQUENCY  PT (By licensed PT), OT (By licensed OT)     PT Frequency: Min 5x weekly OT Frequency: Min 5x weekly            Contractures Contractures Info: Not present    Additional Factors Info   Code Status, Allergies Code Status Info: FULL CODE Allergies Info: No Known Allergies           Current Medications (05/28/2022):  This is the current hospital active medication list Current Facility-Administered Medications  Medication Dose Route Frequency Provider Last Rate Last Admin   acetaminophen (TYLENOL) tablet 650 mg  650 mg Oral Q6H PRN Cox, Amy N, DO       Or   acetaminophen (TYLENOL) suppository 650 mg  650 mg Rectal Q6H PRN Cox, Amy N, DO       atorvastatin (LIPITOR) tablet 20 mg  20 mg Oral Daily Cox, Amy N, DO   20 mg at 05/28/22 1103   enoxaparin (LOVENOX) injection 40 mg  40 mg Subcutaneous QHS Cox, Amy N, DO   40 mg at 05/27/22 2050   hydrALAZINE (APRESOLINE) injection 5 mg  5 mg Intravenous Q6H PRN Cox, Amy N, DO       irbesartan (AVAPRO) tablet 37.5 mg  37.5 mg Oral Daily Wyvonnia Dusky, MD       ondansetron [facility]) tablet 4 mg  4 mg Oral Q6H PRN Cox, Amy N, DO       Or   ondansetron (ZOFRAN) injection 4 mg  4 mg Intravenous Q6H PRN Cox, Amy N, DO       senna-docusate (Senokot-S) tablet 1 tablet  1 tablet Oral QHS PRN Cox, Amy N, DO       sodium chloride flush (NS)  0.9 % injection 3 mL  3 mL Intravenous Q12H Cox, Amy N, DO   3 mL at 05/28/22 1104     Discharge Medications: Please see discharge summary for a list of discharge medications.  Relevant Imaging Results:  Relevant Lab Results:   Additional Information SSN [id number]  Elliot Gurney [place], [place]

## 2022-05-28 NOTE — Progress Notes (Signed)
PROGRESS NOTE    William Valentine  QMV:[id number] DOB: 10/14/1941 DOA: 05/25/2022 PCP: Midge Minium, PA    Assessment & Plan:   Principal Problem:   Syncope Active Problems:   Hypokalemia   AKI (acute kidney injury) ([place])   Essential hypertension   Dementia without behavioral disturbance (HCC)   Elevated troponin  Assessment and Plan: Syncope: etiology unclear. EEG shows no seizure or epileptiform activity but mild generalized nonspecific cerebral dysfunction(encephalopathy). Echo shows EF 28-41%, grade I diastolic dysfunction, no regional wall motion abnormalities & no atrial level shunts.  MRI brain shows asymmetric atrophy involving the mesial left temporal lobe/left hippocampal formation & no other acute intracranial abnormality. PT/OT recs SNF   Elevated troponin: likely secondary to demand ischemia. Echo shows EF 32-44%, grade I diastolic dysfunction, no regional wall motion abnormalities, & no atrial shunts detected.    Dementia: continue w/ supportive care     HTN: will restart ARB today. Hydralazine prn  AKI: resolved    Hypokalemia: WNL today   Normocytic anemia: H&H are stable     DVT prophylaxis: lovenox  Code Status: full  Family Communication:  Disposition Plan: PT/OT recs SNF. Medically stable for SNF but waiting on placement. CM is working on this    Level of care: Med-Surg  Status is: Observation The patient remains OBS appropriate and will d/c before 2 midnights.   Consultants:    Procedures:   Antimicrobials:    Subjective: Pt denies any complaints   Objective: Vitals:   05/27/22 1526 05/27/22 2004 05/28/22 0419 05/28/22 0421  BP: 110/72 119/72 120/68   Pulse: 63 65 72   Resp: '16 17 17   '$ Temp: 98.6 F (37 C) 97.7 F (36.5 C) 97.8 F (36.6 C)   TempSrc:  Oral Oral   SpO2: 100% 100% 100%   Weight:    63.9 kg    Intake/Output Summary (Last 24 hours) at 05/28/2022 0744 Last data filed at 05/28/2022 0102 Gross per 24 hour   Intake 420 ml  Output 1850 ml  Net -1430 ml   Filed Weights   05/25/22 1626 05/27/22 0500 05/28/22 0421  Weight: 65.8 kg 63.5 kg 63.9 kg    Examination:  General exam: Appears calm & comfortable  Respiratory system: clear breath sounds b/l  Cardiovascular system: S1 & S2+. No rubs or gallops  Gastrointestinal system: Abd is soft, NT, ND & normal bowel sounds  Central nervous system: Oriented to self only. Moves all extremities  Psychiatry: judgement and insight appears poor. Appropriate mood and affect    Data Reviewed: I have personally reviewed following labs and imaging studies  CBC: Recent Labs  Lab 05/25/22 1631 05/26/22 0419 05/27/22 0729 05/28/22 0554  WBC 6.1 5.7 4.7 5.7  NEUTROABS 4.5  --   --   --   HGB 11.0* 10.1* 10.7* 10.6*  HCT 35.1* 32.0* 33.2* 32.9*  MCV 86.9 85.6 85.3 83.7  PLT 307 252 261 725   Basic Metabolic Panel: Recent Labs  Lab 05/25/22 1631 05/26/22 0419 05/27/22 0729 05/28/22 0554  NA 149* 143 140 139  K 2.9* 3.8 3.6 3.5  CL 114* 111 108 107  CO2 '27 27 28 27  '$ GLUCOSE 86 91 89 83  BUN 44* 32* 22 22  CREATININE 1.48* 1.33* 0.89 0.92  CALCIUM 8.9 8.6* 8.8* 8.8*  MG 2.2 2.1  --   --   PHOS 2.9  --   --   --    GFR: CrCl cannot be  calculated (Unknown ideal weight.). Liver Function Tests: Recent Labs  Lab 05/25/22 1631  AST 28  ALT 25  ALKPHOS 58  BILITOT 1.1  PROT 6.9  ALBUMIN 3.7   No results for input(s): "LIPASE", "AMYLASE" in the last 168 hours. No results for input(s): "AMMONIA" in the last 168 hours. Coagulation Profile: Recent Labs  Lab 05/25/22 1631  INR 1.0   Cardiac Enzymes: No results for input(s): "CKTOTAL", "CKMB", "CKMBINDEX", "TROPONINI" in the last 168 hours. BNP (last 3 results) No results for input(s): "PROBNP" in the last 8760 hours. HbA1C: No results for input(s): "HGBA1C" in the last 72 hours. CBG: Recent Labs  Lab 05/28/22 0417  GLUCAP 91   Lipid Profile: No results for input(s):  "CHOL", "HDL", "LDLCALC", "TRIG", "CHOLHDL", "LDLDIRECT" in the last 72 hours. Thyroid Function Tests: No results for input(s): "TSH", "T4TOTAL", "FREET4", "T3FREE", "THYROIDAB" in the last 72 hours. Anemia Panel: No results for input(s): "VITAMINB12", "FOLATE", "FERRITIN", "TIBC", "IRON", "RETICCTPCT" in the last 72 hours. Sepsis Labs: Recent Labs  Lab 05/25/22 1735 05/25/22 2106  LATICACIDVEN 1.9 1.7    No results found for this or any previous visit (from the past 240 hour(s)).       Radiology Studies: EEG adult  Result Date: 2022-06-02 Greta Doom, MD     02-Jun-2022  4:06 PM History: 81 yo M being evaluated for possible seizure Sedation: none Technique: This EEG was acquired with electrodes placed according to the International 10-20 electrode system (including Fp1, Fp2, F3, F4, C3, C4, P3, P4, O1, O2, T3, T4, T5, T6, A1, A2, Fz, Cz, Pz). The following electrodes were missing or displaced: none. Background: The background consists of intermixed alpha and beta activities. There is a well defined posterior dominant rhythm of 10 Hz that attenuates with eye opening. Sleep is recorded with normal appearing structures.  In addition, there is mild intrusion into the waking background of generalized irregular delta range activity. Photic stimulation: Physiologic driving is present EEG Abnormalities: 1) mild generalized irregular slow activity Clinical Interpretation: This EEG is consistent with a mild generalized nonspecific cerebral dysfunction (encephalopathy). There was no seizure or seizure predisposition recorded on this study. Please note that lack of epileptiform activity on EEG does not preclude the possibility of epilepsy. Roland Rack, MD [facility] [phone] If 7pm- 7am, please page neurology on call as listed in [place].        Scheduled Meds:  atorvastatin  20 mg Oral Daily   enoxaparin (LOVENOX) injection  40 mg Subcutaneous QHS   sodium  chloride flush  3 mL Intravenous Q12H   Continuous Infusions:   LOS: 0 days    Time spent: 25 mins     Wyvonnia Dusky, MD [facility] Pager [phone]  If 7PM-7AM, please contact night-coverage [email or web address] 05/28/2022, 7:44 AM

## 2022-05-29 DIAGNOSIS — R55 Syncope and collapse: Secondary | ICD-10-CM | POA: Diagnosis not present

## 2022-05-29 DIAGNOSIS — I10 Essential (primary) hypertension: Secondary | ICD-10-CM | POA: Diagnosis not present

## 2022-05-29 DIAGNOSIS — F0390 Unspecified dementia without behavioral disturbance: Secondary | ICD-10-CM | POA: Diagnosis not present

## 2022-05-29 LAB — BASIC METABOLIC PANEL
Anion gap: 4 — ABNORMAL LOW (ref 5–15)
BUN: 23 mg/dL (ref 8–23)
CO2: 28 mmol/L (ref 22–32)
Calcium: 8.6 mg/dL — ABNORMAL LOW (ref 8.9–10.3)
Chloride: 109 mmol/L (ref 98–111)
Creatinine, Ser: 0.88 mg/dL (ref 0.61–1.24)
GFR, Estimated: >60 mL/min (ref >60)
Glucose, Bld: 92 mg/dL (ref 70–99)
Potassium: 3.8 mmol/L (ref 3.5–5.1)
Sodium: 141 mmol/L (ref 135–145)

## 2022-05-29 LAB — CBC
HCT: 31.3 % — ABNORMAL LOW (ref 39.0–52.0)
Hemoglobin: 10.3 g/dL — ABNORMAL LOW (ref 13.0–17.0)
MCH: 27.5 pg (ref 26.0–34.0)
MCHC: 32.9 g/dL (ref 30.0–36.0)
MCV: 83.7 fL (ref 80.0–100.0)
Platelets: 246 10*3/uL (ref 150–400)
RBC: 3.74 MIL/uL — ABNORMAL LOW (ref 4.22–5.81)
RDW: 16 % — ABNORMAL HIGH (ref 11.5–15.5)
WBC: 6.7 10*3/uL (ref 4.0–10.5)
nRBC: 0.5 % — ABNORMAL HIGH (ref 0.0–0.2)

## 2022-05-29 LAB — GLUCOSE, CAPILLARY: Glucose-Capillary: 88 mg/dL (ref 70–99)

## 2022-05-29 MED ORDER — HYDROCHLOROTHIAZIDE 25 MG PO TABS
25.0000 mg | ORAL_TABLET | Freq: Every day | ORAL | Status: DC
  Administered 2022-05-29 – 2022-06-01 (×4): 25 mg via ORAL
  Filled 2022-05-29 (×4): qty 1

## 2022-05-29 NOTE — Progress Notes (Signed)
PROGRESS NOTE    William Valentine  MEQ:[id number] DOB: 03/04/1941 DOA: 05/25/2022 PCP: Midge Minium, PA    Assessment & Plan:   Principal Problem:   Syncope Active Problems:   Hypokalemia   AKI (acute kidney injury) ([place])   Essential hypertension   Dementia without behavioral disturbance (HCC)   Elevated troponin  Assessment and Plan: Syncope: etiology unclear. EEG shows no seizure or epileptiform activity but mild generalized nonspecific cerebral dysfunction(encephalopathy). Echo shows EF 29-79%, grade I diastolic dysfunction, no regional wall motion abnormalities & no atrial level shunts.  MRI brain shows asymmetric atrophy involving the mesial left temporal lobe/left hippocampal formation & no other acute intracranial abnormality. PT/OT recs SNF. Waiting on SNF placement   Elevated troponin: likely secondary to demand ischemia. Echo shows EF 89-21%, grade I diastolic dysfunction, no regional wall motion abnormalities, & no atrial shunts detected.    Dementia: continue w/ supportive care    HTN: continue on home dose of irbesartan & HCTZ   AKI: resolved    Hypokalemia: WNL today    Normocytic anemia: H&H are stable. No need for a transfusion currently     DVT prophylaxis: lovenox  Code Status: full  Family Communication:  Disposition Plan: PT/OT recs SNF. Medically stable for d/c to SNF but waiting on SNF placement   Level of care: Med-Surg  Status is: Observation The patient remains OBS appropriate and will d/c before 2 midnights.   Consultants:    Procedures:   Antimicrobials:    Subjective: Pt denies any complaints   Objective: Vitals:   05/28/22 1647 05/28/22 2055 05/29/22 0500 05/29/22 0535  BP: 125/79 128/68  134/70  Pulse: 68 67  62  Resp: '18 16  16  '$ Temp: 98.5 F (36.9 C) 98 F (36.7 C)  (!) 97.5 F (36.4 C)  TempSrc:  Oral  Oral  SpO2: 100% 98%  100%  Weight:   63.2 kg     Intake/Output Summary (Last 24 hours) at 05/29/2022  0739 Last data filed at 05/29/2022 1941 Gross per 24 hour  Intake --  Output 375 ml  Net -375 ml   Filed Weights   05/27/22 0500 05/28/22 0421 05/29/22 0500  Weight: 63.5 kg 63.9 kg 63.2 kg    Examination:  General exam: Appears comfortable  Respiratory system: decreased breath sounds b/l  Cardiovascular system: S1 & S2+. No rubs or clicks  Gastrointestinal system: abd is soft, NT, ND & hypoactive bowel sounds  Central nervous system: Oriented to self only. Moves all extremities  Psychiatry: judgement and insight appears poor. Flat mood and affect    Data Reviewed: I have personally reviewed following labs and imaging studies  CBC: Recent Labs  Lab 05/25/22 1631 05/26/22 0419 05/27/22 0729 05/28/22 0554 05/29/22 0416  WBC 6.1 5.7 4.7 5.7 6.7  NEUTROABS 4.5  --   --   --   --   HGB 11.0* 10.1* 10.7* 10.6* 10.3*  HCT 35.1* 32.0* 33.2* 32.9* 31.3*  MCV 86.9 85.6 85.3 83.7 83.7  PLT 307 252 261 242 740   Basic Metabolic Panel: Recent Labs  Lab 05/25/22 1631 05/26/22 0419 05/27/22 0729 05/28/22 0554 05/29/22 0416  NA 149* 143 140 139 141  K 2.9* 3.8 3.6 3.5 3.8  CL 114* 111 108 107 109  CO2 '27 27 28 27 28  '$ GLUCOSE 86 91 89 83 92  BUN 44* 32* '22 22 23  '$ CREATININE 1.48* 1.33* 0.89 0.92 0.88  CALCIUM 8.9 8.6* 8.8* 8.8* 8.6*  MG 2.2 2.1  --   --   --   PHOS 2.9  --   --   --   --    GFR: CrCl cannot be calculated (Unknown ideal weight.). Liver Function Tests: Recent Labs  Lab 05/25/22 1631  AST 28  ALT 25  ALKPHOS 58  BILITOT 1.1  PROT 6.9  ALBUMIN 3.7   No results for input(s): "LIPASE", "AMYLASE" in the last 168 hours. No results for input(s): "AMMONIA" in the last 168 hours. Coagulation Profile: Recent Labs  Lab 05/25/22 1631  INR 1.0   Cardiac Enzymes: No results for input(s): "CKTOTAL", "CKMB", "CKMBINDEX", "TROPONINI" in the last 168 hours. BNP (last 3 results) No results for input(s): "PROBNP" in the last 8760 hours. HbA1C: No results  for input(s): "HGBA1C" in the last 72 hours. CBG: Recent Labs  Lab 05/28/22 0417 05/29/22 0535  GLUCAP 91 88   Lipid Profile: No results for input(s): "CHOL", "HDL", "LDLCALC", "TRIG", "CHOLHDL", "LDLDIRECT" in the last 72 hours. Thyroid Function Tests: No results for input(s): "TSH", "T4TOTAL", "FREET4", "T3FREE", "THYROIDAB" in the last 72 hours. Anemia Panel: No results for input(s): "VITAMINB12", "FOLATE", "FERRITIN", "TIBC", "IRON", "RETICCTPCT" in the last 72 hours. Sepsis Labs: Recent Labs  Lab 05/25/22 1735 05/25/22 2106  LATICACIDVEN 1.9 1.7    No results found for this or any previous visit (from the past 240 hour(s)).       Radiology Studies: No results found.      Scheduled Meds:  atorvastatin  20 mg Oral Daily   enoxaparin (LOVENOX) injection  40 mg Subcutaneous QHS   irbesartan  37.5 mg Oral Daily   sodium chloride flush  3 mL Intravenous Q12H   Continuous Infusions:   LOS: 0 days    Time spent: 25 mins     Wyvonnia Dusky, MD [facility] Pager [phone]  If 7PM-7AM, please contact night-coverage [email or web address] 05/29/2022, 7:39 AM

## 2022-05-29 NOTE — Plan of Care (Signed)

## 2022-05-30 DIAGNOSIS — I10 Essential (primary) hypertension: Secondary | ICD-10-CM | POA: Diagnosis not present

## 2022-05-30 DIAGNOSIS — R55 Syncope and collapse: Secondary | ICD-10-CM | POA: Diagnosis not present

## 2022-05-30 DIAGNOSIS — F0390 Unspecified dementia without behavioral disturbance: Secondary | ICD-10-CM | POA: Diagnosis not present

## 2022-05-30 LAB — CBC
HCT: 31.1 % — ABNORMAL LOW (ref 39.0–52.0)
Hemoglobin: 10 g/dL — ABNORMAL LOW (ref 13.0–17.0)
MCH: 27 pg (ref 26.0–34.0)
MCHC: 32.2 g/dL (ref 30.0–36.0)
MCV: 83.8 fL (ref 80.0–100.0)
Platelets: 236 10*3/uL (ref 150–400)
RBC: 3.71 MIL/uL — ABNORMAL LOW (ref 4.22–5.81)
RDW: 15.9 % — ABNORMAL HIGH (ref 11.5–15.5)
WBC: 6.9 10*3/uL (ref 4.0–10.5)
nRBC: 0 % (ref 0.0–0.2)

## 2022-05-30 LAB — BASIC METABOLIC PANEL
Anion gap: 4 — ABNORMAL LOW (ref 5–15)
BUN: 23 mg/dL (ref 8–23)
CO2: 27 mmol/L (ref 22–32)
Calcium: 8.7 mg/dL — ABNORMAL LOW (ref 8.9–10.3)
Chloride: 107 mmol/L (ref 98–111)
Creatinine, Ser: 0.88 mg/dL (ref 0.61–1.24)
GFR, Estimated: >60 mL/min (ref >60)
Glucose, Bld: 90 mg/dL (ref 70–99)
Potassium: 3.7 mmol/L (ref 3.5–5.1)
Sodium: 138 mmol/L (ref 135–145)

## 2022-05-30 LAB — GLUCOSE, CAPILLARY: Glucose-Capillary: 94 mg/dL (ref 70–99)

## 2022-05-30 NOTE — Progress Notes (Signed)
Patients drainage bag replaced along with condom catheter for clean catch/ UA.

## 2022-05-30 NOTE — Progress Notes (Signed)
Occupational Therapy Treatment Patient Details Name: William Valentine MRN: [id number] DOB: 29-Jun-1941 Today's Date: 05/30/2022   History of present illness [name] is a 81 year old male with history of dementia, hypertension, insomnia, who presents emergency department for chief concerns of altered mental status.   OT comments  Upon arrival, pt resting in bed. Pt pleasant and agreeable to OT session. Pt disoriented to time, place, and situation. Able to identify son in room. OT focusing on functional cognition with self-care tasks. Required increased time and repetition to follow one step commands. OT providing verbal and tactile cues for initiation, sequencing, and termination of tasks as pt perseverating on self-care tasks throughout session. Pt required physical assistance this date to complete LB dressing, grooming, posterior hygiene, functional transfers, and functional mobility. Pt left semi-reclined in bed visiting with son. D/C recommendations remain appropriate. OT will continue to follow acutely.   Recommendations for follow up therapy are one component of a multi-disciplinary discharge planning process, led by the attending physician.  Recommendations may be updated based on patient status, additional functional criteria and insurance authorization.    Follow Up Recommendations  Skilled nursing-short term rehab (<3 hours/day)    Assistance Recommended at Discharge Frequent or constant Supervision/Assistance  Patient can return home with the following  A lot of help with walking and/or transfers;A lot of help with bathing/dressing/bathroom;Direct supervision/assist for financial management;Assist for transportation;Direct supervision/assist for medications management;Assistance with cooking/housework   Equipment Recommendations  BSC/3in1    Recommendations for Other Services      Precautions / Restrictions Precautions Precautions: Fall Restrictions Weight Bearing  Restrictions: No       Mobility Bed Mobility Overal bed mobility: Needs Assistance Bed Mobility: Supine to Sit, Sit to Supine     Supine to sit: Supervision Sit to supine: Min assist        Transfers Overall transfer level: Needs assistance Equipment used: Quad cane Transfers: Sit to/from Stand Sit to Stand: Min assist           General transfer comment: needs heavy verbal/tactile cues for sequencing. Has a hard time with transitions.     Balance Overall balance assessment: Needs assistance Sitting-balance support: No upper extremity supported, Feet supported Sitting balance-Leahy Scale: Good     Standing balance support: During functional activity, Single extremity supported Standing balance-Leahy Scale: Fair                             ADL either performed or assessed with clinical judgement   ADL Overall ADL's : Needs assistance/impaired     Grooming: Wash/dry face;Oral care;Sitting;Cueing for sequencing;Set up;Cueing for safety Grooming Details (indicate cue type and reason): Pt completed grooming tasks sitting EOB             Lower Body Dressing: Set up;Sitting/lateral leans Lower Body Dressing Details (indicate cue type and reason): Pt donned/doffed shoes sitting EOB Toilet Transfer: BSC/3in1;Minimal assistance;Cueing for safety;Cueing for sequencing Toilet Transfer Details (indicate cue type and reason): using quad cane, verbal and tactile cues for hand placement, pt hesitant to fully sit down on [facility] Toileting- Clothing Manipulation and Hygiene: Sit to/from stand;Maximal assistance;Cueing for sequencing;Cueing for safety Toileting - Clothing Manipulation Details (indicate cue type and reason): Pt required Max A for posterior hygiene and clothing management to don/doff briefs over B hips.     Functional mobility during ADLs: Cane;Cueing for safety;Cueing for sequencing;Min guard      Extremity/Trunk Assessment Upper Extremity  Assessment Upper Extremity Assessment: Generalized weakness   Lower Extremity Assessment Lower Extremity Assessment: Generalized weakness   Cervical / Trunk Assessment Cervical / Trunk Assessment: Normal    Vision Baseline Vision/History: 3 Glaucoma Patient Visual Report: No change from baseline                Cognition Arousal/Alertness: Awake/alert Behavior During Therapy: Flat affect Overall Cognitive Status: History of cognitive impairments - at baseline Area of Impairment: Attention, Memory, Following commands, Safety/judgement, Awareness, Problem solving                   Current Attention Level: Selective Memory: Decreased short-term memory Following Commands: Follows one step commands inconsistently, Follows one step commands with increased time Safety/Judgement: Decreased awareness of deficits, Decreased awareness of safety Awareness: Intellectual Problem Solving: Slow processing, Decreased initiation, Difficulty sequencing, Requires verbal cues                             Pertinent Vitals/ Pain       Pain Assessment Pain Assessment: No/denies pain  Home Living                                          Prior Functioning/Environment              Frequency  Min 2X/week        Progress Toward Goals  OT Goals(current goals can now be found in the care plan section)  Progress towards OT goals: Progressing toward goals  Acute Rehab OT Goals Patient Stated Goal: to get better OT Goal Formulation: With patient/family Time For Goal Achievement: 06/09/22 Potential to Achieve Goals: Good  Plan Discharge plan remains appropriate;Frequency remains appropriate    Co-evaluation                 AM-PAC OT "6 Clicks" Daily Activity     Outcome Measure   Help from another person eating meals?: A Little Help from another person taking care of personal grooming?: A Little Help from another person toileting, which  includes using toliet, bedpan, or urinal?: A Lot Help from another person bathing (including washing, rinsing, drying)?: A Lot Help from another person to put on and taking off regular upper body clothing?: A Little Help from another person to put on and taking off regular lower body clothing?: A Little 6 Click Score: 16    End of Session Equipment Utilized During Treatment: Gait belt  OT Visit Diagnosis: Muscle weakness (generalized) (M62.81);History of falling (Z91.81)   Activity Tolerance Patient tolerated treatment well   Patient Left in bed;with call bell/phone within reach;with bed alarm set;with family/visitor present   Nurse Communication Mobility status        Time: 7867-6720 OT Time Calculation (min): 46 min  Charges: OT General Charges $OT Visit: 1 Visit OT Treatments $Self Care/Home Management : 38-52 mins  [facility] MS, OTR/L ascom [phone]  05/30/22, 4:42 PM

## 2022-05-30 NOTE — Progress Notes (Signed)
PROGRESS NOTE    William Valentine  ZSW:[id number] DOB: 1941-04-23 DOA: 05/25/2022 PCP: Midge Minium, PA    Assessment & Plan:   Principal Problem:   Syncope Active Problems:   Hypokalemia   AKI (acute kidney injury) ([place])   Essential hypertension   Dementia without behavioral disturbance (HCC)   Elevated troponin  Assessment and Plan: Syncope: etiology unclear. EEG shows no seizure or epileptiform activity but mild generalized nonspecific cerebral dysfunction(encephalopathy). Echo shows EF 32-20%, grade I diastolic dysfunction, no regional wall motion abnormalities & no atrial level shunts.  MRI brain shows asymmetric atrophy involving the mesial left temporal lobe/left hippocampal formation & no other acute intracranial abnormality. PT/OT recs SNF. Still waiting on SNF placement   Elevated troponin: likely secondary to demand ischemia. Echo shows EF 25-42%, grade I diastolic dysfunction, no regional wall motion abnormalities, & no atrial shunts detected.    Dementia: continue w/ supportive care    HTN: continue on home dose of irbesartan & HCTZ  AKI: resolved    Hypokalemia: WNL today   Normocytic anemia: H&H are stable     DVT prophylaxis: lovenox  Code Status: full  Family Communication:  Disposition Plan: PT/OT recs SNF. Medically stable for d/c to SNF but waiting on SNF placement   Level of care: Med-Surg  Status is: Observation The patient remains OBS appropriate and will d/c before 2 midnights.   Consultants:    Procedures:   Antimicrobials:    Subjective: Pt c/o fatigue   Objective: Vitals:   05/29/22 1641 05/29/22 2131 05/30/22 0433 05/30/22 0500  BP: 107/68 (!) 141/78 128/74   Pulse: 69 72 65   Resp: '18 16 16   '$ Temp: 98.2 F (36.8 C) 98.3 F (36.8 C) 98.2 F (36.8 C)   TempSrc: Oral Oral Oral   SpO2: 100% 98% 97%   Weight:    65.3 kg    Intake/Output Summary (Last 24 hours) at 05/30/2022 0738 Last data filed at 05/30/2022  0545 Gross per 24 hour  Intake 240 ml  Output 1850 ml  Net -1610 ml   Filed Weights   05/28/22 0421 05/29/22 0500 05/30/22 0500  Weight: 63.9 kg 63.2 kg 65.3 kg    Examination:  General exam: Appears calm & comfortable  Respiratory system: diminished breath sounds b/l  Cardiovascular system: S1/S2+. No rubs or clicks  Gastrointestinal system: Abd is soft, NT, ND & normal bowel sounds  Central nervous system: Oriented to self only. Moves all extremities  Psychiatry: judgement and insight appears poor. Flat mood and affect     Data Reviewed: I have personally reviewed following labs and imaging studies  CBC: Recent Labs  Lab 05/25/22 1631 05/26/22 0419 05/27/22 0729 05/28/22 0554 05/29/22 0416 05/30/22 0406  WBC 6.1 5.7 4.7 5.7 6.7 6.9  NEUTROABS 4.5  --   --   --   --   --   HGB 11.0* 10.1* 10.7* 10.6* 10.3* 10.0*  HCT 35.1* 32.0* 33.2* 32.9* 31.3* 31.1*  MCV 86.9 85.6 85.3 83.7 83.7 83.8  PLT 307 252 261 242 246 706   Basic Metabolic Panel: Recent Labs  Lab 05/25/22 1631 05/26/22 0419 05/27/22 0729 05/28/22 0554 05/29/22 0416 05/30/22 0406  NA 149* 143 140 139 141 138  K 2.9* 3.8 3.6 3.5 3.8 3.7  CL 114* 111 108 107 109 107  CO2 '27 27 28 27 28 27  '$ GLUCOSE 86 91 89 83 92 90  BUN 44* 32* '22 22 23 23  '$ CREATININE  1.48* 1.33* 0.89 0.92 0.88 0.88  CALCIUM 8.9 8.6* 8.8* 8.8* 8.6* 8.7*  MG 2.2 2.1  --   --   --   --   PHOS 2.9  --   --   --   --   --    GFR: CrCl cannot be calculated (Unknown ideal weight.). Liver Function Tests: Recent Labs  Lab 05/25/22 1631  AST 28  ALT 25  ALKPHOS 58  BILITOT 1.1  PROT 6.9  ALBUMIN 3.7   No results for input(s): "LIPASE", "AMYLASE" in the last 168 hours. No results for input(s): "AMMONIA" in the last 168 hours. Coagulation Profile: Recent Labs  Lab 05/25/22 1631  INR 1.0   Cardiac Enzymes: No results for input(s): "CKTOTAL", "CKMB", "CKMBINDEX", "TROPONINI" in the last 168 hours. BNP (last 3 results) No  results for input(s): "PROBNP" in the last 8760 hours. HbA1C: No results for input(s): "HGBA1C" in the last 72 hours. CBG: Recent Labs  Lab 05/28/22 0417 05/29/22 0535 05/30/22 0434  GLUCAP 91 88 94   Lipid Profile: No results for input(s): "CHOL", "HDL", "LDLCALC", "TRIG", "CHOLHDL", "LDLDIRECT" in the last 72 hours. Thyroid Function Tests: No results for input(s): "TSH", "T4TOTAL", "FREET4", "T3FREE", "THYROIDAB" in the last 72 hours. Anemia Panel: No results for input(s): "VITAMINB12", "FOLATE", "FERRITIN", "TIBC", "IRON", "RETICCTPCT" in the last 72 hours. Sepsis Labs: Recent Labs  Lab 05/25/22 1735 05/25/22 2106  LATICACIDVEN 1.9 1.7    No results found for this or any previous visit (from the past 240 hour(s)).       Radiology Studies: No results found.      Scheduled Meds:  atorvastatin  20 mg Oral Daily   enoxaparin (LOVENOX) injection  40 mg Subcutaneous QHS   hydrochlorothiazide  25 mg Oral Daily   irbesartan  37.5 mg Oral Daily   sodium chloride flush  3 mL Intravenous Q12H   Continuous Infusions:   LOS: 0 days    Time spent: 25 mins     Wyvonnia Dusky, MD [facility] Pager [phone]  If 7PM-7AM, please contact night-coverage [email or web address] 05/30/2022, 7:38 AM

## 2022-05-30 NOTE — Progress Notes (Signed)
Physical Therapy Treatment Patient Details Name: William Valentine MRN: [id number] DOB: 13-Feb-1941 Today's Date: 05/30/2022   History of Present Illness William Valentine is a 81 year old male with history of dementia, hypertension, insomnia, who presents emergency department for chief concerns of altered mental status.    PT Comments    Pt is making gradual progress towards goals with ability to ambulate short distance in room with RW. Needs hands on assist for balance and cognition. Pt very pleasant and agreeable to session. Will continue to progress HEP.    Recommendations for follow up therapy are one component of a multi-disciplinary discharge planning process, led by the attending physician.  Recommendations may be updated based on patient status, additional functional criteria and insurance authorization.  Follow Up Recommendations  Skilled nursing-short term rehab (<3 hours/day) Can patient physically be transported by private vehicle: Yes   Assistance Recommended at Discharge Frequent or constant Supervision/Assistance  Patient can return home with the following A little help with walking and/or transfers;A little help with bathing/dressing/bathroom;Help with stairs or ramp for entrance   Equipment Recommendations  Rolling walker (2 wheels)    Recommendations for Other Services       Precautions / Restrictions Precautions Precautions: Fall Restrictions Weight Bearing Restrictions: No     Mobility  Bed Mobility               General bed mobility comments: NT, received in recliner    Transfers Overall transfer level: Needs assistance Equipment used: Rolling walker (2 wheels) Transfers: Sit to/from Stand Sit to Stand: Min assist           General transfer comment: needs heavy verbal/tactile cues for sequencing. Has a hard time with transitions.    Ambulation/Gait Ambulation/Gait assistance: Min assist Gait Distance (Feet): 10 Feet Assistive device:  Rolling walker (2 wheels) Gait Pattern/deviations: Step-to pattern       General Gait Details: Has difficulty following commands. Needs manual facilitation for RW sequencing.   Stairs             Wheelchair Mobility    Modified Rankin (Stroke Patients Only)       Balance Overall balance assessment: Needs assistance Sitting-balance support: No upper extremity supported, Feet supported Sitting balance-Leahy Scale: Good     Standing balance support: During functional activity, Bilateral upper extremity supported, Single extremity supported Standing balance-Leahy Scale: Poor Standing balance comment: has quick LOB with dynamic movement, needing min/mod assist for recovery                            Cognition Arousal/Alertness: Awake/alert Behavior During Therapy: Flat affect Overall Cognitive Status: History of cognitive impairments - at baseline                                 General Comments: pt pleasant and agreeable for session. Very confused and takes increased processing time        Exercises Other Exercises Other Exercises: seated ther-ex including alt. marching, LAQ, hip add squeezes, and standing heel raises. 10-15 reps performed depending on patients attention to task. Frequent redirection required. Attempted further standing ther-ex including mini squats, however pt unable to coordinate exercise Other Exercises: Pt's bed bare, sheets in room. Made up bed while pt performed seated ther-ex    General Comments        Pertinent Vitals/Pain Pain Assessment Pain Assessment: No/denies pain  Home Living                          Prior Function            PT Goals (current goals can now be found in the care plan section) Acute Rehab PT Goals Patient Stated Goal: unable to state due to confusion PT Goal Formulation: With patient Time For Goal Achievement: 06/09/22 Potential to Achieve Goals: Fair Progress towards  PT goals: Progressing toward goals    Frequency    Min 2X/week      PT Plan Current plan remains appropriate    Co-evaluation              AM-PAC PT "6 Clicks" Mobility   Outcome Measure  Help needed turning from your back to your side while in a flat bed without using bedrails?: None Help needed moving from lying on your back to sitting on the side of a flat bed without using bedrails?: A Little Help needed moving to and from a bed to a chair (including a wheelchair)?: A Little Help needed standing up from a chair using your arms (e.g., wheelchair or bedside chair)?: A Little Help needed to walk in hospital room?: A Lot Help needed climbing 3-5 steps with a railing? : Total 6 Click Score: 16    End of Session Equipment Utilized During Treatment: Gait belt Activity Tolerance: Patient tolerated treatment well Patient left: in chair;with chair alarm set Nurse Communication: Mobility status PT Visit Diagnosis: Muscle weakness (generalized) (M62.81);Difficulty in walking, not elsewhere classified (R26.2);Unsteadiness on feet (R26.81);Repeated falls (R29.6)     Time: 1051-1110 PT Time Calculation (min) (ACUTE ONLY): 19 min  Charges:  $Therapeutic Exercise: 8-22 mins                     Greggory Stallion, PT, DPT, GCS [phone]    William Valentine 05/30/2022, 1:02 PM

## 2022-05-31 DIAGNOSIS — R55 Syncope and collapse: Secondary | ICD-10-CM | POA: Diagnosis not present

## 2022-05-31 DIAGNOSIS — I10 Essential (primary) hypertension: Secondary | ICD-10-CM | POA: Diagnosis not present

## 2022-05-31 DIAGNOSIS — F0390 Unspecified dementia without behavioral disturbance: Secondary | ICD-10-CM | POA: Diagnosis not present

## 2022-05-31 LAB — BASIC METABOLIC PANEL
Anion gap: 6 (ref 5–15)
BUN: 21 mg/dL (ref 8–23)
CO2: 28 mmol/L (ref 22–32)
Calcium: 8.9 mg/dL (ref 8.9–10.3)
Chloride: 106 mmol/L (ref 98–111)
Creatinine, Ser: 0.89 mg/dL (ref 0.61–1.24)
GFR, Estimated: >60 mL/min (ref >60)
Glucose, Bld: 84 mg/dL (ref 70–99)
Potassium: 3.7 mmol/L (ref 3.5–5.1)
Sodium: 140 mmol/L (ref 135–145)

## 2022-05-31 LAB — CBC
HCT: 33.1 % — ABNORMAL LOW (ref 39.0–52.0)
Hemoglobin: 10.5 g/dL — ABNORMAL LOW (ref 13.0–17.0)
MCH: 26.9 pg (ref 26.0–34.0)
MCHC: 31.7 g/dL (ref 30.0–36.0)
MCV: 84.9 fL (ref 80.0–100.0)
Platelets: 226 10*3/uL (ref 150–400)
RBC: 3.9 MIL/uL — ABNORMAL LOW (ref 4.22–5.81)
RDW: 15.9 % — ABNORMAL HIGH (ref 11.5–15.5)
WBC: 6.3 10*3/uL (ref 4.0–10.5)
nRBC: 0 % (ref 0.0–0.2)

## 2022-05-31 LAB — GLUCOSE, CAPILLARY: Glucose-Capillary: 80 mg/dL (ref 70–99)

## 2022-05-31 MED ORDER — TRIPLE ANTIBIOTIC 3.5-400-5000 EX OINT
TOPICAL_OINTMENT | Freq: Two times a day (BID) | CUTANEOUS | Status: DC
  Filled 2022-05-31 (×4): qty 1

## 2022-05-31 NOTE — Progress Notes (Signed)
PROGRESS NOTE   HPI as per Dr. Tobie Poet: [name] is a 81 year old male with history of dementia, hypertension, insomnia, who presents emergency department for chief concerns of altered mental status.  Initial vitals in the emergency department showed temperature of 97.9, respiration rate of 16, heart rate of 80, blood pressure 115/63, SpO2 99% on room air.  Serum sodium is 149, potassium 2.9, chloride 114, bicarb 27, BUN of 44, serum creatinine 1.48, GFR 43, WBC 6.1, hemoglobin 11, platelets of 307.  High sensitive troponin was 52.   Per EDP, patient was getting a haircut via his son.  His son states that he noticed abnormal rhythmic motions of his father's extremities.  Son noticed his father's eyes rolling back and he was unresponsive for about 3 minutes.    When EMS arrived, patient was found to be hypotensive.    ED treatment: LR 1 L bolus.   At bedside, patient was able to tell me his name and his age. He was not able to tell me the current calendar year or current month after multiple attempts. He was able to tell me that the current place is hospital after given him multiple choices. He is not able to tell me the current president. Per spouse and son at bedside, this is patient's baseline.    William Valentine (son at bedside), who was giving patient a shave. Son noticed that patient had shaking hands, son asked patient 'what's going on?'. Patient was non-responsive. Per son, he noticed his father's eyes rolled back. Patient was not responsive for approximately 3.5-4 minutes.    EMS was called for further evaluation. Per ED report, patient was hypotensive when EMS checked patient's vitals.   As per Dr. Jimmye Norman 8/10-8/15/23: Pt presented after a syncopal episode of unknown etiology. The syncopal work-up was unremarkable including EEG, echo, CT head & neck & MRI brain. Please see below for results. PT/OT evaluated the pt and recommended SNF. Waiting on SNF placement    William Valentine   WIO:[id number] DOB: 1941/08/20 DOA: 05/25/2022 PCP: William Minium, PA    Assessment & Plan:   Principal Problem:   Syncope Active Problems:   Hypokalemia   AKI (acute kidney injury) ([facility])   Essential hypertension   Dementia without behavioral disturbance (HCC)   Elevated troponin  Assessment and Plan: Syncope: etiology unclear. EEG shows no seizure or epileptiform activity but mild generalized nonspecific cerebral dysfunction(encephalopathy). Echo shows EF 42-68%, grade I diastolic dysfunction, no regional wall motion abnormalities & no atrial level shunts.  MRI brain shows asymmetric atrophy involving the mesial left temporal lobe/left hippocampal formation & no other acute intracranial abnormality. PT/OT recs SNF. Still waiting on SNF placement   Elevated troponin: likely secondary to demand ischemia. Echo shows EF 34-19%, grade I diastolic dysfunction, no regional wall motion abnormalities, & no atrial shunts detected.    Dementia: continue w/ supportive care    HTN: continue on home dose of HCTZ & irbesartan (hospital substitute)   AKI: resolved    Hypokalemia: WNL today   Normocytic anemia: H&H are stable. No need for a transfusion currently     DVT prophylaxis: lovenox  Code Status: full  Family Communication:  Disposition Plan: PT/OT recs SNF. Medically stable for d/c to SNF but waiting on SNF placement   Level of care: Med-Surg  Status is: Observation The patient remains OBS appropriate and will d/c before 2 midnights.   Consultants:    Procedures:   Antimicrobials:    Subjective: Pt  c/o malaise   Objective: Vitals:   05/30/22 1947 05/31/22 0459 05/31/22 0500 05/31/22 0755  BP: 124/65 129/71  126/77  Pulse: 72 89  62  Resp: '16 16  17  '$ Temp: 98.1 F (36.7 C) 98 F (36.7 C)  98.6 F (37 C)  TempSrc:      SpO2: 99% 100%  98%  Weight:   64.8 kg     Intake/Output Summary (Last 24 hours) at 05/31/2022 0757 Last data filed at 05/30/2022  1942 Gross per 24 hour  Intake --  Output 0 ml  Net 0 ml   Filed Weights   05/29/22 0500 05/30/22 0500 05/31/22 0500  Weight: 63.2 kg 65.3 kg 64.8 kg    Examination:  General exam: Appears calm & comfortable  Respiratory system: decreased breath sounds b/l Cardiovascular system: S1 & S2+. No rubs or clicks  Gastrointestinal system: Abd is soft, NT, ND & hypoactive bowel sounds  Central nervous system: Oriented to self only. Moves all extremities  Psychiatry: judgement and insight appears poor. Flat mood and affect    Data Reviewed: I have personally reviewed following labs and imaging studies  CBC: Recent Labs  Lab 05/25/22 1631 05/26/22 0419 05/27/22 0729 05/28/22 0554 05/29/22 0416 05/30/22 0406 05/31/22 0449  WBC 6.1   < > 4.7 5.7 6.7 6.9 6.3  NEUTROABS 4.5  --   --   --   --   --   --   HGB 11.0*   < > 10.7* 10.6* 10.3* 10.0* 10.5*  HCT 35.1*   < > 33.2* 32.9* 31.3* 31.1* 33.1*  MCV 86.9   < > 85.3 83.7 83.7 83.8 84.9  PLT 307   < > 261 242 246 236 226   < > = values in this interval not displayed.   Basic Metabolic Panel: Recent Labs  Lab 05/25/22 1631 05/26/22 0419 05/27/22 0729 05/28/22 0554 05/29/22 0416 05/30/22 0406 05/31/22 0449  NA 149* 143 140 139 141 138 140  K 2.9* 3.8 3.6 3.5 3.8 3.7 3.7  CL 114* 111 108 107 109 107 106  CO2 '27 27 28 27 28 27 28  '$ GLUCOSE 86 91 89 83 92 90 84  BUN 44* 32* '22 22 23 23 21  '$ CREATININE 1.48* 1.33* 0.89 0.92 0.88 0.88 0.89  CALCIUM 8.9 8.6* 8.8* 8.8* 8.6* 8.7* 8.9  MG 2.2 2.1  --   --   --   --   --   PHOS 2.9  --   --   --   --   --   --    GFR: CrCl cannot be calculated (Unknown ideal weight.). Liver Function Tests: Recent Labs  Lab 05/25/22 1631  AST 28  ALT 25  ALKPHOS 58  BILITOT 1.1  PROT 6.9  ALBUMIN 3.7   No results for input(s): "LIPASE", "AMYLASE" in the last 168 hours. No results for input(s): "AMMONIA" in the last 168 hours. Coagulation Profile: Recent Labs  Lab 05/25/22 1631  INR  1.0   Cardiac Enzymes: No results for input(s): "CKTOTAL", "CKMB", "CKMBINDEX", "TROPONINI" in the last 168 hours. BNP (last 3 results) No results for input(s): "PROBNP" in the last 8760 hours. HbA1C: No results for input(s): "HGBA1C" in the last 72 hours. CBG: Recent Labs  Lab 05/28/22 0417 05/29/22 0535 05/30/22 0434 05/31/22 0612  GLUCAP 91 88 94 80   Lipid Profile: No results for input(s): "CHOL", "HDL", "LDLCALC", "TRIG", "CHOLHDL", "LDLDIRECT" in the last 72 hours. Thyroid Function Tests: No results  for input(s): "TSH", "T4TOTAL", "FREET4", "T3FREE", "THYROIDAB" in the last 72 hours. Anemia Panel: No results for input(s): "VITAMINB12", "FOLATE", "FERRITIN", "TIBC", "IRON", "RETICCTPCT" in the last 72 hours. Sepsis Labs: Recent Labs  Lab 05/25/22 1735 05/25/22 2106  LATICACIDVEN 1.9 1.7    No results found for this or any previous visit (from the past 240 hour(s)).       Radiology Studies: No results found.      Scheduled Meds:  atorvastatin  20 mg Oral Daily   enoxaparin (LOVENOX) injection  40 mg Subcutaneous QHS   hydrochlorothiazide  25 mg Oral Daily   irbesartan  37.5 mg Oral Daily   sodium chloride flush  3 mL Intravenous Q12H   Continuous Infusions:   LOS: 0 days    Time spent: 25 mins     Wyvonnia Dusky, MD [facility] Pager [phone]  If 7PM-7AM, please contact night-coverage [email or web address] 05/31/2022, 7:57 AM

## 2022-05-31 NOTE — TOC Progression Note (Signed)
Transition of Care [facility]) - Progression Note    Patient Details  Name: William Valentine MRN: [id number] Date of Birth: December 28, 1940  Transition of Care [facility]) CM/SW [place], RN Phone Number: 05/31/2022, 2:24 PM  Clinical Narrative:    approved 8/15 - 8/17, next review due 8/17.  Reference ID: [id number].  Plan Auth still generating at this time. [facility] can take patients tomorrow.   Expected Discharge Plan: [place]    Expected Discharge Plan and Services Expected Discharge Plan: [place]   Discharge Planning Services: CM Consult   Living arrangements for the past 2 months: Single Family Home                                       Social Determinants of Health (SDOH) Interventions    Readmission Risk Interventions     No data to display

## 2022-05-31 NOTE — Consult Note (Addendum)
[place] Nurse Consult Note: Reason for Consult: Consult requested for penis.  Pt has a partial thickness abrasion which occurred when he pulled off his condom cath that was placed previously, according to the nursing staff.  Just below the head of the anterior penis there is a pink, dry, partial thickness abrasion, .5X.8X.1cm. Dressing procedure/placement/frequency: Topical treatment orders provided for bedside nurses to promote moist healing as follows: Apply antibiotic ointment to shaft of penis BID, can leave open to air. Please re-consult if further assistance is needed.  Thank-you,  Julien Girt MSN, [place], [place], [place], [place]

## 2022-05-31 NOTE — TOC Progression Note (Signed)
Transition of Care [facility]) - Progression Note    Patient Details  Name: William Valentine MRN: [id number] Date of Birth: Dec 10, 1940  [facility]) CM/SW [place], RN Phone Number: 05/31/2022, 10:55 AM  Clinical Narrative:   Authorization in progress for [facility] in [place], which was family's first choice of facility.    Expected Discharge Plan: [place]    Expected Discharge Plan and Services Expected Discharge Plan: [place]   Discharge Planning Services: CM Consult   Living arrangements for the past 2 months: Single Family Home                                       Social Determinants of Health (SDOH) Interventions    Readmission Risk Interventions     No data to display

## 2022-06-01 DIAGNOSIS — R55 Syncope and collapse: Secondary | ICD-10-CM | POA: Diagnosis not present

## 2022-06-01 DIAGNOSIS — N179 Acute kidney failure, unspecified: Secondary | ICD-10-CM | POA: Diagnosis not present

## 2022-06-01 DIAGNOSIS — I10 Essential (primary) hypertension: Secondary | ICD-10-CM | POA: Diagnosis not present

## 2022-06-01 DIAGNOSIS — F0390 Unspecified dementia without behavioral disturbance: Secondary | ICD-10-CM | POA: Diagnosis not present

## 2022-06-01 LAB — GLUCOSE, CAPILLARY: Glucose-Capillary: 86 mg/dL (ref 70–99)

## 2022-06-01 MED ORDER — TRIPLE ANTIBIOTIC 3.5-400-5000 EX OINT: 1.0000 | TOPICAL_OINTMENT | Freq: Two times a day (BID) | CUTANEOUS | 0 refills | Status: DC

## 2022-06-01 NOTE — Plan of Care (Signed)

## 2022-06-01 NOTE — TOC Transition Note (Addendum)
Transition of Care [facility]) - CM/SW Discharge Note   Patient Details  Name: William Valentine MRN: [id number] Date of Birth: 1941-01-19  Transition of Care [facility]) CM/SW Contact:  [name], LCSW Phone Number: 06/01/2022, 5:34 PM   Clinical Narrative:     Patient will DC to: [facility] Anticipated DC date: 06/01/22 Family notified: spouse lvm Transport by: Johnanna Schneiders  Per MD patient ready for DC to [facility]. RN, patient, patient's family, and facility notified of DC. Discharge Summary sent to facility. RN given number for report  [phone] . DC packet on chart. Ambulance transport requested for patient.  CSW signing off.  Pricilla Riffle, LCSW    Final next level of care: Skilled Nursing Facility Barriers to Discharge: No Barriers Identified   Patient Goals and CMS Choice Patient states their goals for this hospitalization and ongoing recovery are:: to go home CMS Medicare.gov Compare Post Acute Care list provided to:: Patient Choice offered to / list presented to : Patient  Discharge Placement              Patient chooses bed at:  [facility])     Patient and family notified of of transfer: 06/01/22  Discharge Plan and Services   Discharge Planning Services: CM Consult                                 Social Determinants of Health ([place]) Interventions     Readmission Risk Interventions     No data to display

## 2022-06-01 NOTE — Progress Notes (Signed)
Physical Therapy Treatment Patient Details Name: William Valentine MRN: [id number] DOB: 1941/04/15 Today's Date: 06/01/2022   History of Present Illness [name] is a 81 year old male with history of dementia, hypertension, insomnia, who presents emergency department for chief concerns of altered mental status.    PT Comments    Patient alert, agreeable to PT, oriented to name only. Noted for flat affect throughout, but agreeable and follow all simple commands, occasional repetition needed. He was able to transfer to EOB with supervision, fair sitting balance. To come up into standing the pt pulls on RW. cues for initiation slight steadying needed once up in standing due to posterior lean, Cga-minA. He ambulated ~21f but needed minA to assist with RW navigation/management. Pt unable to navigate obstacles in room. Up in chair with chair alarm on at end of session. Of note pt unable to visually scan, perform H test, or identify number of fingers in direct visual field with PT. Unclear to determine if cognitive function or visual deficits. RN notified. The patient would benefit from further skilled PT intervention to continue to progress towards goals. Recommendation remains appropriate.    Recommendations for follow up therapy are one component of a multi-disciplinary discharge planning process, led by the attending physician.  Recommendations may be updated based on patient status, additional functional criteria and insurance authorization.  Follow Up Recommendations  Skilled nursing-short term rehab (<3 hours/day) Can patient physically be transported by private vehicle: Yes   Assistance Recommended at Discharge Frequent or constant Supervision/Assistance  Patient can return home with the following A little help with walking and/or transfers;A little help with bathing/dressing/bathroom;Help with stairs or ramp for entrance   Equipment Recommendations  Rolling walker (2 wheels)     Recommendations for Other Services       Precautions / Restrictions Precautions Precautions: Fall Restrictions Weight Bearing Restrictions: No     Mobility  Bed Mobility Overal bed mobility: Needs Assistance Bed Mobility: Supine to Sit     Supine to sit: Supervision          Transfers Overall transfer level: Needs assistance Equipment used: Rolling walker (2 wheels) Transfers: Sit to/from Stand Sit to Stand: Min guard, Min assist           General transfer comment: pulls on RW. cues for initiation slight steadying needed once up in standing due to posterior lean    Ambulation/Gait Ambulation/Gait assistance: Min assist Gait Distance (Feet): 20 Feet           General Gait Details: Needs manual facilitation for RW sequencing cannot navigate obstacles in room without assistance   Stairs             Wheelchair Mobility    Modified Rankin (Stroke Patients Only)       Balance Overall balance assessment: Needs assistance Sitting-balance support: Feet supported Sitting balance-Leahy Scale: Fair     Standing balance support: During functional activity, Bilateral upper extremity supported Standing balance-Leahy Scale: Fair                              Cognition Arousal/Alertness: Awake/alert Behavior During Therapy: Flat affect Overall Cognitive Status: History of cognitive impairments - at baseline Area of Impairment: Attention, Memory, Following commands, Safety/judgement, Awareness, Problem solving                   Current Attention Level: Selective Memory: Decreased short-term memory Following Commands: Follows one step commands  consistently Safety/Judgement: Decreased awareness of safety   Problem Solving: Slow processing, Decreased initiation, Difficulty sequencing, Requires verbal cues          Exercises      General Comments        Pertinent Vitals/Pain Pain Assessment Pain Assessment: Faces Faces  Pain Scale: No hurt    Home Living                          Prior Function            PT Goals (current goals can now be found in the care plan section) Progress towards PT goals: Progressing toward goals    Frequency    Min 2X/week      PT Plan Current plan remains appropriate    Co-evaluation              AM-PAC PT "6 Clicks" Mobility   Outcome Measure  Help needed turning from your back to your side while in a flat bed without using bedrails?: None Help needed moving from lying on your back to sitting on the side of a flat bed without using bedrails?: None Help needed moving to and from a bed to a chair (including a wheelchair)?: A Little Help needed standing up from a chair using your arms (e.g., wheelchair or bedside chair)?: A Little Help needed to walk in hospital room?: A Lot Help needed climbing 3-5 steps with a railing? : A Lot 6 Click Score: 18    End of Session Equipment Utilized During Treatment: Gait belt Activity Tolerance: Patient tolerated treatment well Patient left: in chair;with chair alarm set;with call bell/phone within reach Nurse Communication: Mobility status PT Visit Diagnosis: Muscle weakness (generalized) (M62.81);Difficulty in walking, not elsewhere classified (R26.2);Unsteadiness on feet (R26.81);Repeated falls (R29.6)     Time: 3818-2993 PT Time Calculation (min) (ACUTE ONLY): 14 min  Charges:  $Therapeutic Activity: 8-22 mins                     Lieutenant Diego PT, DPT 9:28 AM,06/01/22

## 2022-06-01 NOTE — Progress Notes (Signed)
OT Cancellation Note  Patient Details Name: William Valentine MRN: [id number] DOB: 1941/08/24   Cancelled Treatment:    Reason Eval/Treat Not Completed: Other (comment) Pt sitting up in bed eating lunch and politely declining to work with therapy. Declined needing assistance with self-feeding. Requesting OT to come back later, will re-attempt as able or f/u at next available service date.   Doneta Public 06/01/2022, 2:40 PM

## 2022-06-01 NOTE — Progress Notes (Signed)
RE: William Valentine  Date of Birth: Oct 23, 2040  Date: 06/01/22  To Whom It May Concern:  Please be advised that the above-named patient will require a short-term nursing home stay - anticipated 30 days or less for rehabilitation and strengthening. The plan is for return home.  [place], [place]

## 2022-06-01 NOTE — Discharge Summary (Signed)
Physician Discharge Summary   Patient: William Valentine MRN: [id number] DOB: [birth year]  Admit date:     05/25/2022  Discharge date: 06/01/22  Discharge Physician: Annita Brod   PCP: William Minium, PA   Recommendations at discharge:   Medication change: K. Dur discontinued New medication: Neosporin ointment applied topically 2 times a day to patient's penis  Discharge Diagnoses: Principal Problem:   Syncope Active Problems:   Hypokalemia   AKI (acute kidney injury) ([place])   Essential hypertension   Dementia without behavioral disturbance (HCC)   Elevated troponin  Resolved Problems:   * No resolved hospital problems. *  Hospital Course: 81 year old male with past medical history of dementia and hypertension who presented to the emergency room with episode of unresponsiveness followed by confusion.  Work-up unrevealing although did note some acute kidney injury which has since been treated.  Patient was evaluated by PT and OT recommended skilled nursing with placement pending.   Assessment and Plan: * Syncope Noted to be hypotensive on admission and given a liter of fluids.  CT scan of head, MRI brain, EEG and echo all unremarkable.  Blood pressure has since improved & blood pressure medications restarted.  Seen by PT and OT and recommended for skilled nursing which patient will go to on 8/16.  Elevated troponin - Etiology workup in progress at this time - Low clinical suspicion for ACS as patient denies chest pain and there was no ischemic changes on EKG - Query myocardial injury versus cardiorenal - Check BNP, high sensitive troponin - If high sensitive troponin is markedly elevated we can start heparin gtt. - Complete echo ordered - AM team to consult cardiology if indicated pending repeat high sensitive troponin and complete echo  Dementia without behavioral disturbance ([place]) - Per family patient appears to be at baseline mental status at this time  Essential  hypertension Initially oral antihypertensives were held.  Following fluid resuscitation, blood pressure started trending up and medications were restarted.  Blood pressure has since been stable.  AKI (acute kidney injury) ([place]) Secondary to dehydration.  Antihypertensive medications held and patient treated with IV fluids and creatinine normalized.  Hypokalemia Noted to be hypokalemic on admission.  Potassium replaced.         Consultants: None Procedures performed: Echocardiogram Disposition: Skilled nursing facility Diet recommendation:  Cardiac diet DISCHARGE MEDICATION: Allergies as of 06/01/2022   No Known Allergies      Medication List     STOP taking these medications    potassium chloride 20 MEQ packet Commonly known as: KLOR-CON       TAKE these medications    ascorbic acid 100 MG tablet Commonly known as: VITAMIN C Take 100 mg by mouth daily.   aspirin EC 81 MG tablet Take 81 mg by mouth in the morning.   atorvastatin 20 MG tablet Commonly known as: LIPITOR Take 20 mg by mouth daily.   dorzolamide-timolol 22.3-6.8 MG/ML ophthalmic solution Commonly known as: COSOPT Place 1 drop into the right eye 2 (two) times daily.   neomycin-bacitracin-polymyxin [phone] Oint Apply 1 Application topically 2 (two) times daily.   olmesartan-hydrochlorothiazide 40-25 MG tablet Commonly known as: BENICAR HCT Take 1 tablet by mouth daily.   rivastigmine 3 MG capsule Commonly known as: EXELON Take 3 mg by mouth in the morning and at bedtime.   SUPER B COMPLEX/C PO Take 1 tablet by mouth daily.   Vitamin D3 10 MCG (400 UNIT) tablet Take 400 Units by mouth daily.  Contact information for after-discharge care     Destination     [facility] .   Service: Skilled Nursing Contact information: [address] [phone]                    Discharge Exam: Filed Weights    05/30/22 0500 05/31/22 0500 06/01/22 0500  Weight: 65.3 kg 64.8 kg 62.4 kg   General: Alert and oriented x2, no acute distress Cardiovascular: Regular rate and rhythm, S1-S2 Lungs: Clear to auscultation bilaterally  Condition at discharge: good  The results of significant diagnostics from this hospitalization (including imaging, microbiology, ancillary and laboratory) are listed below for reference.   Imaging Studies: EEG adult  Result Date: 2022-06-03 William Doom, MD     June 03, 2022  4:06 PM History: 81 yo M being evaluated for possible seizure Sedation: none Technique: This EEG was acquired with electrodes placed according to the International 10-20 electrode system (including Fp1, Fp2, F3, F4, C3, C4, P3, P4, O1, O2, T3, T4, T5, T6, A1, A2, Fz, Cz, Pz). The following electrodes were missing or displaced: none. Background: The background consists of intermixed alpha and beta activities. There is a well defined posterior dominant rhythm of 10 Hz that attenuates with eye opening. Sleep is recorded with normal appearing structures.  In addition, there is mild intrusion into the waking background of generalized irregular delta range activity. Photic stimulation: Physiologic driving is present EEG Abnormalities: 1) mild generalized irregular slow activity Clinical Interpretation: This EEG is consistent with a mild generalized nonspecific cerebral dysfunction (encephalopathy). There was no seizure or seizure predisposition recorded on this study. Please note that lack of epileptiform activity on EEG does not preclude the possibility of epilepsy. Roland Rack, MD [facility] [phone] If 7pm- 7am, please page neurology on call as listed in [place].   ECHOCARDIOGRAM COMPLETE  Result Date: 2022-06-03    ECHOCARDIOGRAM REPORT   Patient Name:   William Valentine Date of Exam: 05/25/2022 Medical Rec #:  [id number]       Height:       71.0 in Accession #:    [id number]      Weight:        145.0 lb Date of Birth:  August 24, 1941       BSA:          1.839 m Patient Age:    20 years        BP:           141/72 mmHg Patient Gender: M               HR:           72 bpm. Exam Location:  [facility] Procedure: 2D Echo, Cardiac Doppler and Color Doppler Indications:     R55 Syncope  History:         Patient has no prior history of Echocardiogram examinations.                  Risk Factors:Diabetes and Dyslipidemia. Dementia.  Sonographer:     Idus Rathke RDCS Referring Phys:  [id number] AMY N COX Diagnosing Phys: Kathlyn Sacramento MD IMPRESSIONS  1. Left ventricular ejection fraction, by estimation, is 55 to 60%. The left ventricle has normal function. The left ventricle has no regional wall motion abnormalities. There is moderate asymmetric left ventricular hypertrophy of the basal-septal segment. Left ventricular diastolic parameters are consistent with Grade I diastolic dysfunction (impaired relaxation).  2. Right ventricular systolic function is normal. The right ventricular size is normal. There is normal pulmonary artery systolic pressure.  3. The mitral valve is myxomatous. Mild mitral valve regurgitation. No evidence of mitral stenosis.  4. Tricuspid valve regurgitation is moderate.  5. The aortic valve is normal in structure. Aortic valve regurgitation is mild. Aortic valve sclerosis/calcification is present, without any evidence of aortic stenosis. FINDINGS  Left Ventricle: Left ventricular ejection fraction, by estimation, is 55 to 60%. The left ventricle has normal function. The left ventricle has no regional wall motion abnormalities. The left ventricular internal cavity size was normal in size. There is  moderate asymmetric left ventricular hypertrophy of the basal-septal segment. Left ventricular diastolic parameters are consistent with Grade I diastolic dysfunction (impaired relaxation). Right Ventricle: The right ventricular size is normal. No increase in right ventricular wall thickness.  Right ventricular systolic function is normal. There is normal pulmonary artery systolic pressure. The tricuspid regurgitant velocity is 2.38 m/s, and  with an assumed right atrial pressure of 5 mmHg, the estimated right ventricular systolic pressure is 35.0 mmHg. Left Atrium: Left atrial size was normal in size. Right Atrium: Right atrial size was normal in size. Pericardium: There is no evidence of pericardial effusion. Mitral Valve: The mitral valve is myxomatous. There is moderate thickening of the mitral valve leaflet(s). Mild mitral valve regurgitation. No evidence of mitral valve stenosis. Tricuspid Valve: The tricuspid valve is normal in structure. Tricuspid valve regurgitation is moderate . No evidence of tricuspid stenosis. Aortic Valve: The aortic valve is normal in structure. Aortic valve regurgitation is mild. Aortic valve sclerosis/calcification is present, without any evidence of aortic stenosis. Pulmonic Valve: The pulmonic valve was normal in structure. Pulmonic valve regurgitation is mild. No evidence of pulmonic stenosis. Aorta: The aortic root is normal in size and structure. Venous: The inferior vena cava was not well visualized. IAS/Shunts: No atrial level shunt detected by color flow Doppler.  LEFT VENTRICLE PLAX 2D LVIDd:         3.70 cm   Diastology LVIDs:         2.60 cm   LV e' medial:    5.87 cm/s LV PW:         1.10 cm   LV E/e' medial:  11.3 LV IVS:        1.10 cm   LV e' lateral:   7.40 cm/s LVOT diam:     2.10 cm   LV E/e' lateral: 8.9 LV SV:         68 LV SV Index:   37 LVOT Area:     3.46 cm  RIGHT VENTRICLE RV Basal diam:  3.80 cm RV S prime:     13.65 cm/s TAPSE (M-mode): 1.8 cm LEFT ATRIUM             Index        RIGHT ATRIUM           Index LA diam:        3.30 cm 1.79 cm/m   RA Area:     13.90 cm LA Vol (A2C):   30.2 ml 16.42 ml/m  RA Volume:   41.20 ml  22.40 ml/m LA Vol (A4C):   34.9 ml 18.98 ml/m LA Biplane Vol: 34.1 ml 18.54 ml/m  AORTIC VALVE LVOT Vmax:   90.00  cm/s LVOT Vmean:  61.600 cm/s LVOT VTI:    0.195 m  AORTA Ao Root diam: 4.10 cm Ao Asc diam:  3.80  cm MITRAL VALVE               TRICUSPID VALVE MV Area (PHT): 2.29 cm    TR Peak grad:   22.7 mmHg MV Decel Time: 331 msec    TR Vmax:        238.00 cm/s MV E velocity: 66.20 cm/s MV A velocity: 94.10 cm/s  SHUNTS MV E/A ratio:  0.70        Systemic VTI:  0.20 m                            Systemic Diam: 2.10 cm Kathlyn Sacramento MD Electronically signed by Kathlyn Sacramento MD Signature Date/Time: 05/26/2022/9:34:25 AM    Final    MR BRAIN WO CONTRAST  Result Date: 05/26/2022 CLINICAL DATA:  Initial evaluation for acute seizure like activity. EXAM: MRI HEAD WITHOUT CONTRAST TECHNIQUE: Multiplanar, multiecho pulse sequences of the brain and surrounding structures were obtained without intravenous contrast. COMPARISON:  Prior head CT from earlier the same day. FINDINGS: Brain: Examination mildly degraded by motion artifact. Diffuse prominence of the CSF and spaces compatible generalized cerebral atrophy. Scattered patchy T2/FLAIR hyperintensity involving the periventricular white matter and pons, most consistent with chronic small vessel ischemic disease, mild for age. No abnormal foci of restricted diffusion to suggest acute or subacute ischemia or changes related to seizure. Gray-white matter differentiation maintained. No areas of chronic cortical infarction. No acute or chronic intracranial blood products. No mass lesion, midline shift or mass effect. No hydrocephalus. No extra-axial fluid collection. Pituitary gland and suprasellar region within normal limits. Asymmetric atrophy involving the mesial left temporal lobe/left hippocampal formation (series 15, image 21). No convincing signal abnormality seen. Given the provided history of seizure, findings could reflect changes of mesial temporal sclerosis. Vascular: Major intracranial vascular flow voids are grossly maintained on this motion degraded exam. Skull and upper  cervical spine: Craniocervical junction with normal limits. Bone marrow signal intensity grossly within normal limits. No scalp soft tissue abnormality. Sinuses/Orbits: Globes and orbital soft tissues demonstrate no acute finding. Paranasal sinuses are largely clear. No mastoid effusion. Other: None. IMPRESSION: 1. Asymmetric atrophy involving the mesial left temporal lobe/left hippocampal formation. While this finding may be related to patient's advanced age and cerebral atrophy, possible changes of mesial temporal sclerosis could be considered given the provided history of seizure. Correlation with EEG recommended. 2. No other acute intracranial abnormality. 3. Underlying age-related cerebral atrophy with mild chronic small vessel ischemic disease. Electronically Signed   By: Jeannine Boga M.D.   On: 05/26/2022 03:06   CT Cervical Spine Wo Contrast  Result Date: 05/25/2022 CLINICAL DATA:  Trauma, seizures EXAM: CT CERVICAL SPINE WITHOUT CONTRAST TECHNIQUE: Multidetector CT imaging of the cervical spine was performed without intravenous contrast. Multiplanar CT image reconstructions were also generated. RADIATION DOSE REDUCTION: This exam was performed according to the departmental dose-optimization program which includes automated exposure control, adjustment of the mA and/or kV according to patient size and/or use of iterative reconstruction technique. COMPARISON:  09/20/2021 FINDINGS: Alignment: Reversal of lordosis may be due to muscle spasm to positioning. Alignment of the posterior margins of vertebral bodies is otherwise unremarkable. Skull base and vertebrae: No recent fracture is seen. Soft tissues and spinal canal: There is spinal stenosis at multiple levels, more so at C6-C7 level caused by bony spurs and bulging of the annulus. Disc levels: There is encroachment of neural foramina from C3 to T2 levels. Upper chest: Unremarkable.  Other: Scattered arterial calcifications are seen. IMPRESSION: No  recent fracture is seen in cervical spine. 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 There is encroachment of neural foramina from C3-T2 levels. Electronically Signed   By: Elmer Picker M.D.   On: 05/25/2022 18:17   DG Chest Port 1 View  Result Date: 05/25/2022 CLINICAL DATA:  Questionable sepsis. EXAM: PORTABLE CHEST 1 VIEW COMPARISON:  None Available. FINDINGS: The heart size and mediastinal contours are within normal limits. Both lungs are clear. The visualized skeletal structures are unremarkable. IMPRESSION: No active disease. Electronically Signed   By: Fidela Salisbury M.D.   On: 05/25/2022 17:13   CT HEAD WO CONTRAST (5MM)  Result Date: 05/25/2022 CLINICAL DATA:  Mental status change, unknown cause. EXAM: CT HEAD WITHOUT CONTRAST TECHNIQUE: Contiguous axial images were obtained from the base of the skull through the vertex without intravenous contrast. RADIATION DOSE REDUCTION: This exam was performed according to the departmental dose-optimization program which includes automated exposure control, adjustment of the mA and/or kV according to patient size and/or use of iterative reconstruction technique. COMPARISON:  CT head and cervical spine 09/20/2021 FINDINGS: Brain: There is no evidence of an acute infarct, intracranial hemorrhage, mass, midline shift, or extra-axial fluid collection. Mild cerebral atrophy is within normal limits for age. A cavum septum pellucidum et vergae is noted, a normal variant. Hypodensities in the cerebral white matter bilaterally are unchanged and nonspecific but compatible with mild chronic small vessel ischemic disease. Vascular: Calcified atherosclerosis at the skull base. No hyperdense vessel. Skull: Mild widening of the atlantodental interval which is new from the prior cervical spine CT. No skull fracture. Sinuses/Orbits: Visualized paranasal sinuses and mastoid air cells are clear. Postoperative changes to the globes. Other: None.  IMPRESSION: 1. No evidence of acute intracranial abnormality. 2. Mild chronic small vessel ischemic disease. 3. New atlantodental widening which could be degenerative or traumatic. Correlate for any history of recent trauma and consider cervical spine CT for further evaluation if clinically warranted. Electronically Signed   By: Logan Bores M.D.   On: 05/25/2022 17:08    Microbiology: Results for orders placed or performed during the hospital encounter of 05/25/22  Urine Culture     Status: Abnormal (Preliminary result)   Collection Time: 05/31/22 12:19 AM   Specimen: Urine, Random  Result Value Ref Range Status   Specimen Description   Final    URINE, RANDOM Performed at [facility], [address], [place], [place] [address]    Special Requests   Final    NONE Performed at [facility], [address], [place], [place] [address]    Culture (A)  Final    30,000 COLONIES/mL PROTEUS MIRABILIS CULTURE REINCUBATED FOR BETTER GROWTH Performed at [facility], [place] [address] [place],  [address]    Report Status PENDING  Incomplete    Labs: CBC: Recent Labs  Lab 05/25/22 1631 05/26/22 0419 05/27/22 0729 05/28/22 0554 05/29/22 0416 05/30/22 0406 05/31/22 0449  WBC 6.1   < > 4.7 5.7 6.7 6.9 6.3  NEUTROABS 4.5  --   --   --   --   --   --   HGB 11.0*   < > 10.7* 10.6* 10.3* 10.0* 10.5*  HCT 35.1*   < > 33.2* 32.9* 31.3* 31.1* 33.1*  MCV 86.9   < > 85.3 83.7 83.7 83.8 84.9  PLT 307   < > 261 242 246 236 226   < > =  values in this interval not displayed.   Basic Metabolic Panel: Recent Labs  Lab 05/25/22 1631 05/26/22 0419 05/27/22 0729 05/28/22 0554 05/29/22 0416 05/30/22 0406 05/31/22 0449  NA 149* 143 140 139 141 138 140  K 2.9* 3.8 3.6 3.5 3.8 3.7 3.7  CL 114* 111 108 107 109 107 106  CO2 '27 27 28 27 28 27 28  '$ GLUCOSE 86 91 89 83 92 90 84  BUN 44* 32* '22 22 23 23 21  '$ CREATININE 1.48* 1.33* 0.89 0.92 0.88 0.88 0.89  CALCIUM 8.9  8.6* 8.8* 8.8* 8.6* 8.7* 8.9  MG 2.2 2.1  --   --   --   --   --   PHOS 2.9  --   --   --   --   --   --    Liver Function Tests: Recent Labs  Lab 05/25/22 1631  AST 28  ALT 25  ALKPHOS 58  BILITOT 1.1  PROT 6.9  ALBUMIN 3.7   CBG: Recent Labs  Lab 05/28/22 0417 05/29/22 0535 05/30/22 0434 05/31/22 0612 06/01/22 0557  GLUCAP 91 88 94 80 86    Discharge time spent: less than 30 minutes.  Signed: Annita Brod, MD [facility] 06/01/2022

## 2022-06-03 LAB — URINE CULTURE: Culture: 30000 — AB

## 2022-08-07 ENCOUNTER — Emergency Department
Admission: EM | Admit: 2022-08-07 | Discharge: 2022-08-07 | Disposition: A | Discharge status: Home / Self Care | Payer: Medicare Other | Attending: Emergency Medicine | Admitting: Emergency Medicine

## 2022-08-07 ENCOUNTER — Emergency Department: Payer: Medicare Other

## 2022-08-07 DIAGNOSIS — R569 Unspecified convulsions: Secondary | ICD-10-CM | POA: Diagnosis not present

## 2022-08-07 DIAGNOSIS — E860 Dehydration: Secondary | ICD-10-CM | POA: Diagnosis not present

## 2022-08-07 DIAGNOSIS — F0390 Unspecified dementia without behavioral disturbance: Secondary | ICD-10-CM | POA: Diagnosis not present

## 2022-08-07 LAB — BASIC METABOLIC PANEL
Anion gap: 6 (ref 5–15)
BUN: 47 mg/dL — ABNORMAL HIGH (ref 8–23)
CO2: 25 mmol/L (ref 22–32)
Calcium: 8.5 mg/dL — ABNORMAL LOW (ref 8.9–10.3)
Chloride: 117 mmol/L — ABNORMAL HIGH (ref 98–111)
Creatinine, Ser: 1.63 mg/dL — ABNORMAL HIGH (ref 0.61–1.24)
GFR, Estimated: 42 mL/min — ABNORMAL LOW (ref >60)
Glucose, Bld: 54 mg/dL — ABNORMAL LOW (ref 70–99)
Potassium: 3 mmol/L — ABNORMAL LOW (ref 3.5–5.1)
Sodium: 148 mmol/L — ABNORMAL HIGH (ref 135–145)

## 2022-08-07 LAB — CBC
HCT: 36.4 % — ABNORMAL LOW (ref 39.0–52.0)
Hemoglobin: 11.1 g/dL — ABNORMAL LOW (ref 13.0–17.0)
MCH: 26.9 pg (ref 26.0–34.0)
MCHC: 30.5 g/dL (ref 30.0–36.0)
MCV: 88.3 fL (ref 80.0–100.0)
Platelets: 224 10*3/uL (ref 150–400)
RBC: 4.12 MIL/uL — ABNORMAL LOW (ref 4.22–5.81)
RDW: 17.2 % — ABNORMAL HIGH (ref 11.5–15.5)
WBC: 7.5 10*3/uL (ref 4.0–10.5)
nRBC: 0 % (ref 0.0–0.2)

## 2022-08-07 LAB — URINALYSIS, ROUTINE W REFLEX MICROSCOPIC
Bilirubin Urine: NEGATIVE
Glucose, UA: NEGATIVE mg/dL
Hgb urine dipstick: NEGATIVE
Ketones, ur: NEGATIVE mg/dL
Leukocytes,Ua: NEGATIVE
Nitrite: NEGATIVE
Protein, ur: NEGATIVE mg/dL
Specific Gravity, Urine: 1.017 (ref 1.005–1.030)
pH: 5 (ref 5.0–8.0)

## 2022-08-07 LAB — CBG MONITORING, ED: Glucose-Capillary: 93 mg/dL (ref 70–99)

## 2022-08-07 LAB — MAGNESIUM: Magnesium: 2 mg/dL (ref 1.7–2.4)

## 2022-08-07 MED ORDER — SODIUM CHLORIDE 0.9 % IV BOLUS
1000.0000 mL | Freq: Once | INTRAVENOUS | Status: AC
  Administered 2022-08-07: 1000 mL via INTRAVENOUS

## 2022-08-07 MED ORDER — POTASSIUM CHLORIDE 10 MEQ/100ML IV SOLN
10.0000 meq | INTRAVENOUS | Status: AC
  Administered 2022-08-07 (×2): 10 meq via INTRAVENOUS
  Filled 2022-08-07 (×2): qty 100

## 2022-08-07 NOTE — ED Triage Notes (Signed)
Pt to ED via EMS from home. Pt had a witnessed seizure by nephew. Pt sat down then had full body shakes per nephew. Last seizure reported sept of this year.

## 2022-08-07 NOTE — ED Provider Notes (Signed)
[facility] Provider Note    Event Date/Time   First MD Initiated Contact with Patient 08/07/22 1553     (approximate)   History   Seizures   HPI  William Valentine is a 81 y.o. male  who presents to the emergency department today because of concern for a seizure like episode. Patient has history of dementia so history is primarily obtained from family at bedside. They state they were sitting with the patient outside when they went to grab something from their truck. They heard the sounds of a glass falling on the ground and when they got back to the patient they noted he had tremors of his bilateral hands. Was not responsive at that time but was able to squeeze his fingers. Family state that over the past 2 weeks they have noticed increased confusion and disorientation as well as worsening odor to his urine. No fevers. No shortness of breath.        Physical Exam   Triage Vital Signs: ED Triage Vitals [08/07/22 1540]  Enc Vitals Group     BP      Pulse      Resp      Temp      Temp src      SpO2      Weight 145 lb (65.8 kg)     Height '5\' 11"'$  (1.803 m)     Head Circumference      Peak Flow      Pain Score      Pain Loc      Pain Edu?      Excl. in [place]?     Most recent vital signs: Vitals:   08/07/22 1600  BP: (!) 100/59  Resp: 20    General: Awake, alert, not oriented. CV:  Good peripheral perfusion. Regular rate and rhythm. Resp:  Normal effort. Lungs clear. Abd:  No distention. Non tender.   ED Results / Procedures / Treatments   Labs (all labs ordered are listed, but only abnormal results are displayed) Labs Reviewed  CBC - Abnormal; Notable for the following components:      Result Value   RBC 4.12 (*)    Hemoglobin 11.1 (*)    HCT 36.4 (*)    RDW 17.2 (*)    All other components within normal limits  BASIC METABOLIC PANEL  CBG MONITORING, ED     EKG  I, Nance Pear, attending physician, personally viewed and  interpreted this EKG  EKG Time: 1542 Rate: 74 Rhythm: sinus rhythm with arrythmia Axis: left axis deviation Intervals: qtc 469 QRS: LVH ST changes: no st elevation Impression: abnormal ekg    RADIOLOGY I independently interpreted and visualized the CT head. My interpretation: No bleed Radiology interpretation:  IMPRESSION:  1. No acute intracranial pathology.  2. Mild age-related atrophy and chronic microvascular ischemic  changes.     PROCEDURES:  Critical Care performed: No  Procedures   MEDICATIONS ORDERED IN ED: Medications - No data to display   IMPRESSION / MDM / [place] / ED COURSE  I reviewed the triage vital signs and the nursing notes.                              Differential diagnosis includes, but is not limited to, seizure, syncope, electrolyte abnormality, intracranial pathology.  Patient's presentation is most consistent with acute presentation with potential threat to life or  bodily function.  The patient is on the cardiac monitor to evaluate for evidence of arrhythmia and/or significant heart rate changes.  Patient presented to the emergency department from home because of concerns for seizure-like activity.  The time my exam patient is awake alert however not oriented.  Patient does have history of dementia.  Had admission a couple months ago for similar episode.  Blood work here is concerning for dehydration with elevation creatinine.  Additionally patient is hypokalemic.  We will start IV fluids and replete potassium.  Additionally will get head CT and check urine given family report of worsening odor.   Head CT without concerning abnormality. Urine is not consistent with infection. Patient did improve here in the emergency department after IVFs and family stated he was back to baseline. At this time given reassuring work up except for slightly elevated creatinine (per care everywhere creatinine of 1.3 on 9.28.23) and clinical  improvement do feel patient is safe for discharge. Discussed with family importance of close PCP follow up for blood work recheck and continued hydration.   FINAL CLINICAL IMPRESSION(S) / ED DIAGNOSES   Final diagnoses:  Seizure-like activity ([place])  Dehydration     Note:  This document was prepared using Dragon voice recognition software and may include unintentional dictation errors.    Nance Pear, MD 08/07/22 2108

## 2022-08-07 NOTE — Discharge Instructions (Signed)
Please follow up with primary care to recheck kidney values next week. Please seek medical attention for any high fevers, chest pain, shortness of breath, change in behavior, persistent vomiting, bloody stool or any other new or concerning symptoms.

## 2022-08-07 NOTE — ED Notes (Signed)
Patient transported to CT 

## 2022-08-11 ENCOUNTER — Ambulatory Visit: Payer: Medicare Other | Admitting: Podiatry

## 2022-12-21 ENCOUNTER — Emergency Department
Admission: EM | Admit: 2022-12-21 | Discharge: 2022-12-21 | Disposition: A | Discharge status: Home / Self Care | Payer: Medicare Other | Attending: Emergency Medicine | Admitting: Emergency Medicine

## 2022-12-21 ENCOUNTER — Other Ambulatory Visit: Payer: Self-pay

## 2022-12-21 DIAGNOSIS — R4182 Altered mental status, unspecified: Secondary | ICD-10-CM | POA: Diagnosis present

## 2022-12-21 DIAGNOSIS — F0390 Unspecified dementia without behavioral disturbance: Secondary | ICD-10-CM | POA: Insufficient documentation

## 2022-12-21 DIAGNOSIS — I10 Essential (primary) hypertension: Secondary | ICD-10-CM | POA: Insufficient documentation

## 2022-12-21 LAB — BASIC METABOLIC PANEL
Anion gap: 8 (ref 5–15)
BUN: 26 mg/dL — ABNORMAL HIGH (ref 8–23)
CO2: 24 mmol/L (ref 22–32)
Calcium: 8.3 mg/dL — ABNORMAL LOW (ref 8.9–10.3)
Chloride: 112 mmol/L — ABNORMAL HIGH (ref 98–111)
Creatinine, Ser: 0.97 mg/dL (ref 0.61–1.24)
GFR, Estimated: >60 mL/min (ref >60)
Glucose, Bld: 165 mg/dL — ABNORMAL HIGH (ref 70–99)
Potassium: 3.5 mmol/L (ref 3.5–5.1)
Sodium: 144 mmol/L (ref 135–145)

## 2022-12-21 LAB — CBC WITH DIFFERENTIAL/PLATELET
Abs Immature Granulocytes: 0.01 10*3/uL (ref 0.00–0.07)
Basophils Absolute: 0.1 10*3/uL (ref 0.0–0.1)
Basophils Relative: 2 %
Eosinophils Absolute: 0.2 10*3/uL (ref 0.0–0.5)
Eosinophils Relative: 4 %
HCT: 35 % — ABNORMAL LOW (ref 39.0–52.0)
Hemoglobin: 10.7 g/dL — ABNORMAL LOW (ref 13.0–17.0)
Immature Granulocytes: 0 %
Lymphocytes Relative: 21 %
Lymphs Abs: 0.8 10*3/uL (ref 0.7–4.0)
MCH: 25.8 pg — ABNORMAL LOW (ref 26.0–34.0)
MCHC: 30.6 g/dL (ref 30.0–36.0)
MCV: 84.3 fL (ref 80.0–100.0)
Monocytes Absolute: 0.3 10*3/uL (ref 0.1–1.0)
Monocytes Relative: 8 %
Neutro Abs: 2.5 10*3/uL (ref 1.7–7.7)
Neutrophils Relative %: 65 %
Platelets: 383 10*3/uL (ref 150–400)
RBC: 4.15 MIL/uL — ABNORMAL LOW (ref 4.22–5.81)
RDW: 17.2 % — ABNORMAL HIGH (ref 11.5–15.5)
WBC: 3.9 10*3/uL — ABNORMAL LOW (ref 4.0–10.5)
nRBC: 0 % (ref 0.0–0.2)

## 2022-12-21 MED ORDER — SODIUM CHLORIDE 0.9 % IV BOLUS
500.0000 mL | Freq: Once | INTRAVENOUS | Status: AC
  Administered 2022-12-21: 500 mL via INTRAVENOUS

## 2022-12-21 NOTE — ED Provider Notes (Signed)
[facility] Provider Note    Event Date/Time   First MD Initiated Contact with Patient 12/21/22 1149     (approximate)   History   Altered Mental Status   HPI  HPI limited due to dementia  ANEESH WHITTENBERGER is a 82 y.o. male with a history of dementia and hypertension who presents with an episode of altered mental status.  Per the caregiver, he was in his usual state of health this morning and was eating breakfast when he suddenly became unresponsive.  This lasted about 15 to 30 minutes.  He has now returned back to his baseline mental status.  He had no tremor, convulsion, or seizure-like activity.  Currently the patient is alert but confused and unable to give any history.  I reviewed the past medical records.  He was most recently hospitalized in August of last year.  Per the hospitalist discharge summary from 8/16 he presented with syncope and was noted to be hypotensive.  CT head, MRI, EEG, and echo were unremarkable while in the hospital.  The patient is followed by [facility] since 2/1 for severe dementia and is DNR.   Physical Exam   Triage Vital Signs: ED Triage Vitals  Enc Vitals Group     BP      Pulse      Resp      Temp      Temp src      SpO2      Weight      Height      Head Circumference      Peak Flow      Pain Score      Pain Loc      Pain Edu?      Excl. in [place]?     Most recent vital signs: Vitals:   12/21/22 1300 12/21/22 1345  BP: (!) 172/80   Pulse: 71 66  Resp: 16 11  SpO2: 99% 99%     General: Awake, confused, no distress.  CV:  Good peripheral perfusion.  Tachycardic, regular rhythm. Resp:  Normal effort.  Abd:  Soft and nontender.  No distention.  Other:  EOMI PERRLA.  No facial droop.  Motor intact in all extremities.   ED Results / Procedures / Treatments   Labs (all labs ordered are listed, but only abnormal results are displayed) Labs Reviewed  BASIC METABOLIC PANEL - Abnormal; Notable for the  following components:      Result Value   Chloride 112 (*)    Glucose, Bld 165 (*)    BUN 26 (*)    Calcium 8.3 (*)    All other components within normal limits  CBC WITH DIFFERENTIAL/PLATELET - Abnormal; Notable for the following components:   WBC 3.9 (*)    RBC 4.15 (*)    Hemoglobin 10.7 (*)    HCT 35.0 (*)    MCH 25.8 (*)    RDW 17.2 (*)    All other components within normal limits     EKG  ED ECG REPORT I, Arta Silence, the attending physician, personally viewed and interpreted this ECG.  Date: 12/21/2022 EKG Time: 1152 Rate: 123 Rhythm: Atrial fibrillation versus flutter QRS Axis: normal Intervals: LAFB, prolonged QTc ST/T Wave abnormalities: LVH with repolarization abnormality Narrative Interpretation: no evidence of acute ischemia    RADIOLOGY   PROCEDURES:  Critical Care performed: No  Procedures   MEDICATIONS ORDERED IN ED: Medications  sodium chloride 0.9 % bolus 500 mL (  0 mLs Intravenous Stopped 12/21/22 1300)     IMPRESSION / MDM / ASSESSMENT AND PLAN / ED COURSE  I reviewed the triage vital signs and the nursing notes.  82 year old male with PMH as noted above, recently started on hospice last month presents with an episode of altered mental status/decreased responsiveness that lasted approximately 15 to 30 minutes, and is now back to his baseline.  On exam he is tachycardic and appears to be in atrial fibrillation or flutter, but vital signs and exam are otherwise unremarkable.  Differential diagnosis includes, but is not limited to, dehydration or electrolyte abnormality, other metabolic disturbance, acute infection, atrial fibrillation or other cardiac arrhythmia.  There is no evidence of acute stroke.  We will obtain basic labs, give a fluid bolus, and reassess.  I will await the patient's family to help determine goals of care and further workup and treatment.  Patient's presentation is most consistent with acute complicated illness /  injury requiring diagnostic workup.  The patient is on the cardiac monitor to evaluate for evidence of arrhythmia and/or significant heart rate changes.   ----------------------------------------- 1:43 PM on 12/21/2022 -----------------------------------------  The patient's heart rate has returned to the 70s and a sinus rhythm without further intervention.  Lab workup is unremarkable.  Electrolytes are normal.  Creatinine is baseline.  WBC count is borderline low but the hemoglobin is consistent with baseline as well.  The patient remains asymptomatic.  I discussed the results of the workup and possible etiologies of his presentation with the wife who is now here.  At this time I do not see a specific indication for brain imaging, cardiac enzymes, or further emergent workup given the patient's chronic conditions and hospice status.  The wife agrees with this.  She feels comfortable with the patient going home and does not want any additional workup in the ED.  I gave thorough return precautions and she expressed understanding.  FINAL CLINICAL IMPRESSION(S) / ED DIAGNOSES   Final diagnoses:  Altered mental status, unspecified altered mental status type     Rx / DC Orders   ED Discharge Orders     None        Note:  This document was prepared using Dragon voice recognition software and may include unintentional dictation errors.    Arta Silence, MD 12/21/22 [phone]

## 2022-12-21 NOTE — Discharge Instructions (Signed)
William Valentine should return to the emergency department for new, worsening, recurrent episodes of passing out or change in consciousness, fever, vomiting, seizure-like activity, or any other new or worsening symptoms that are concerning; otherwise he may follow-up with his regular doctors and hospice program as needed.

## 2022-12-21 NOTE — ED Triage Notes (Signed)
[facility] states they were called out for the patient becoming unresponsive. They state he had a pulse and was breathing, but not responding to anything. Caregiver states he was sitting at the table eating breakfast and became unresponsive. Does have a hx of apnea and dementia. He is on hospice, but ems does not know why. Pt woke up when he got to the room. Is able to state name and wife's name. He is confused. Unknown if he is at baseline at this time. Given 300 mL NS en route and dstick 233.

## 2023-02-15 DEATH — deceased

## 2023-09-16 IMAGING — CT CT CERVICAL SPINE W/O CM
3 of 4 series · 12 of 33 positions shown, 14 images · non-contrast
Comparison: None.

CLINICAL DATA: Falling.  Dizziness.

EXAM:
CT CERVICAL SPINE WITHOUT CONTRAST
TECHNIQUE: Multidetector CT imaging of the cervical spine was performed without
intravenous contrast. Multiplanar CT image reconstructions were also
generated.

[Series 4: sagittal bone · sagittal · 0.30mm/px · 5 of 63 slices shown, 6 images]
[im 21/63  bone]
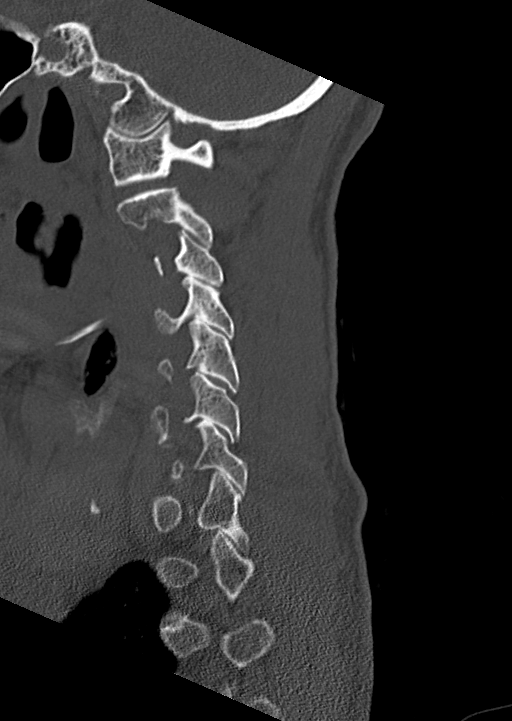
[im 26/63  bone]
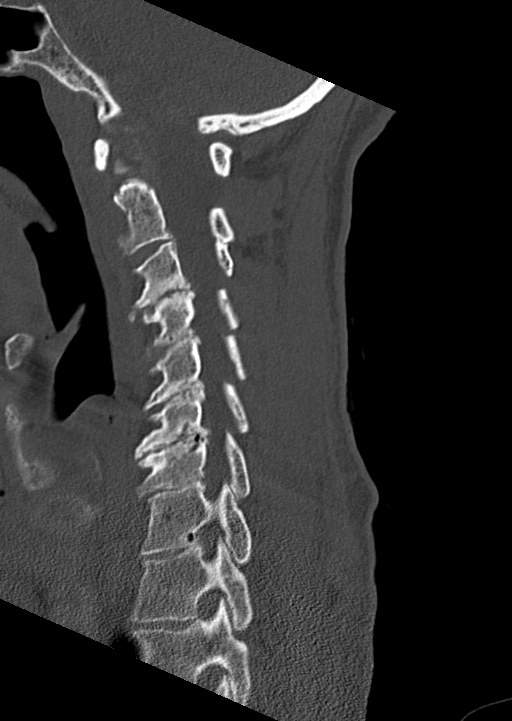
[im 32/63  soft-tissue]
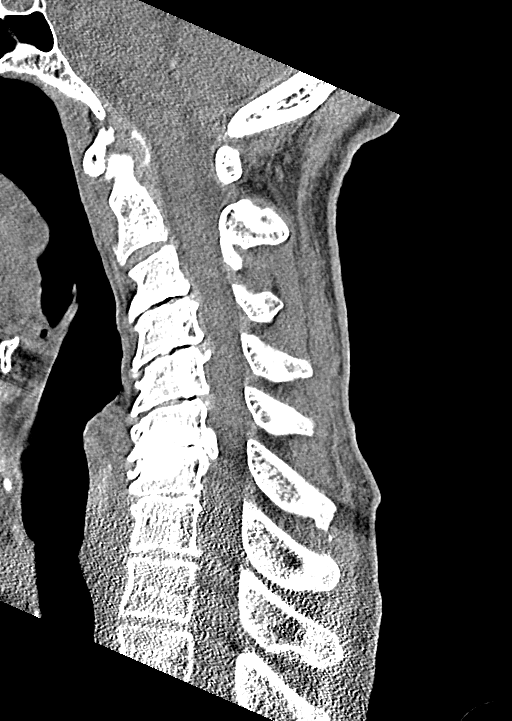
[im 32/63  bone]
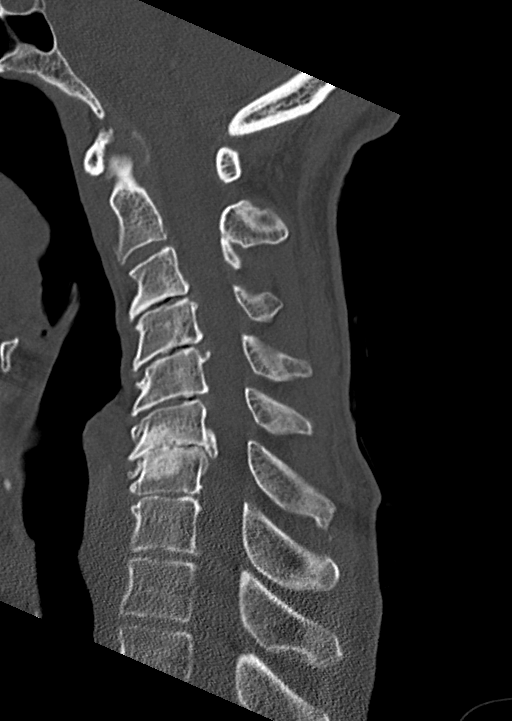
[im 37/63  bone]
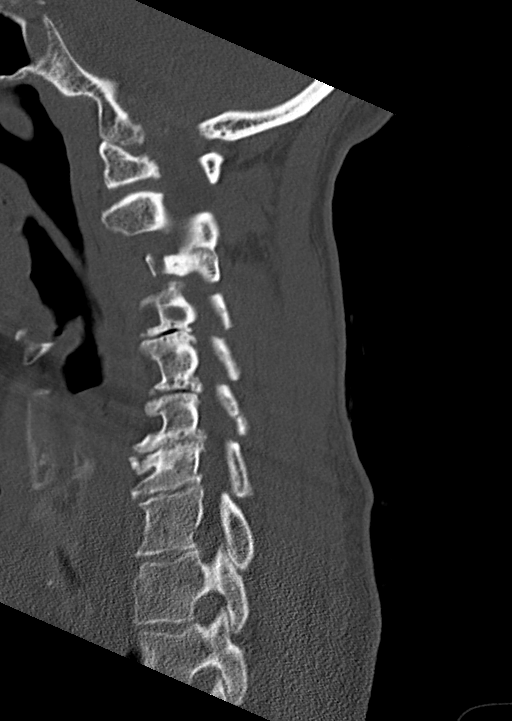
[im 42/63  bone]
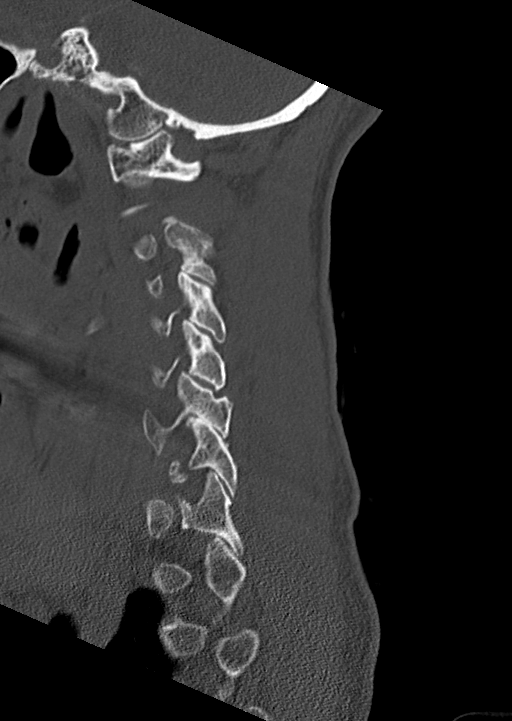

[Series 5: coronal bone · coronal · 0.24mm/px · 3 of 77 slices shown]
[im 20/77  bone]
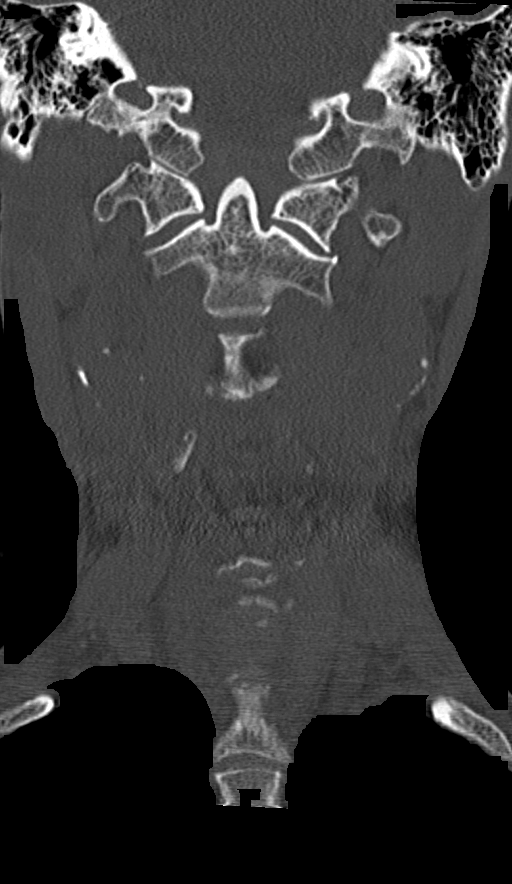
[im 32/77  bone]
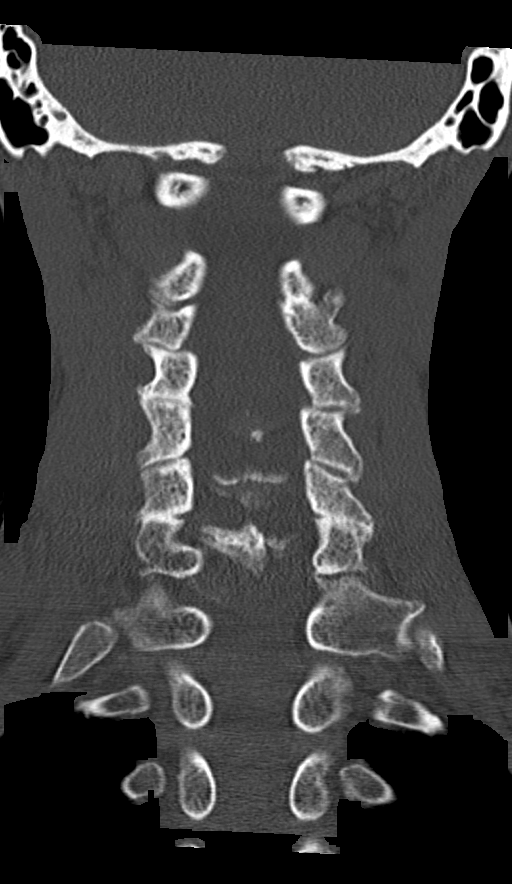
[im 45/77  bone]
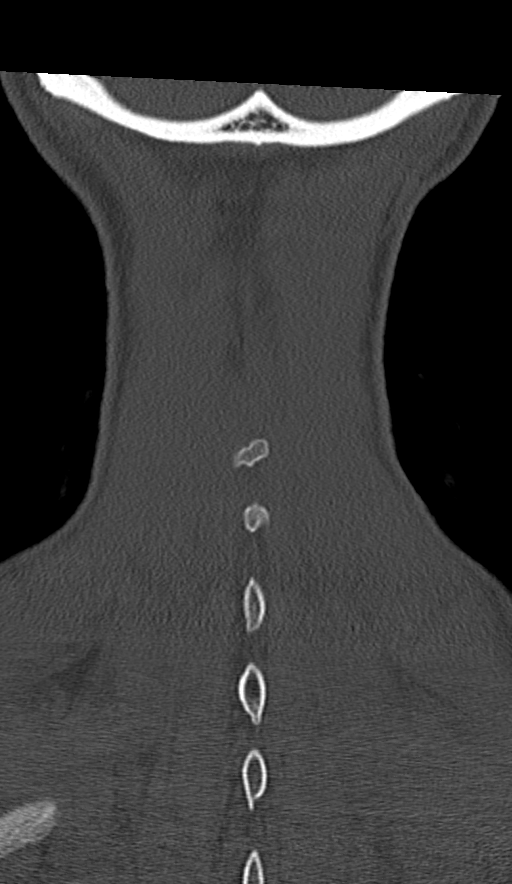

[Series 6: orthogonal bone · axial · 0.24mm/px · z∈[+927,+1066]mm · 4 of 108 slices shown, 5 images]
[im 16/108  soft-tissue]
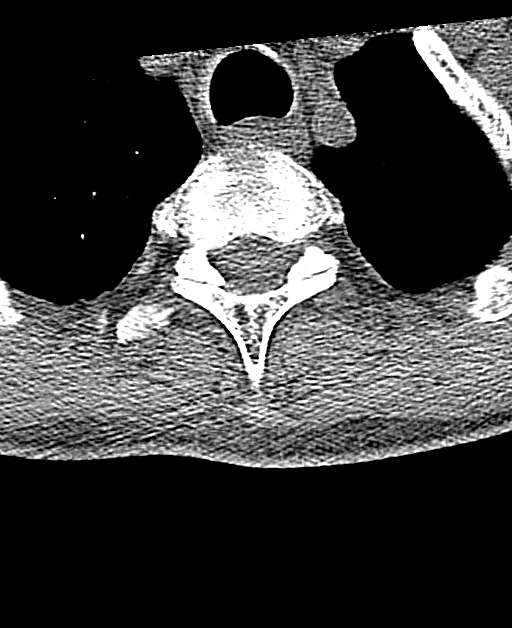
[im 16/108  bone]
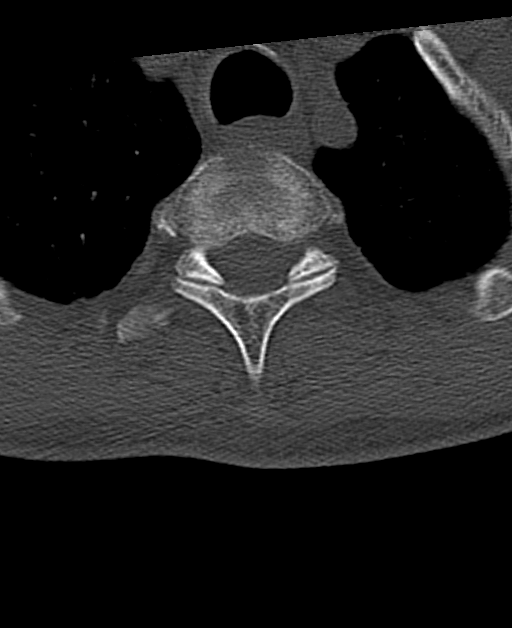
[im 46/108  bone]
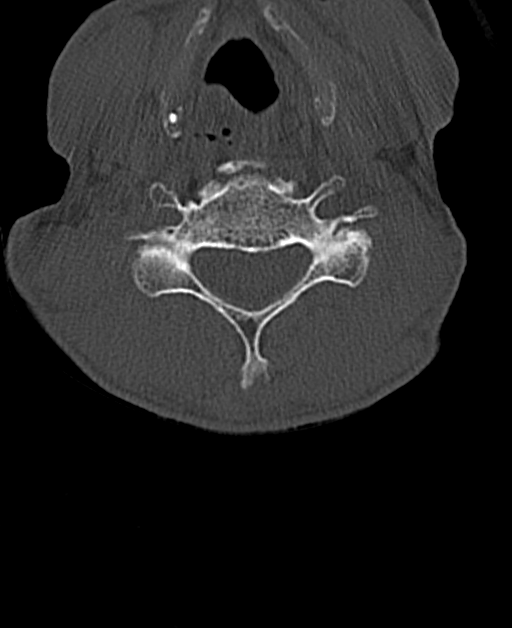
[im 62/108  bone]
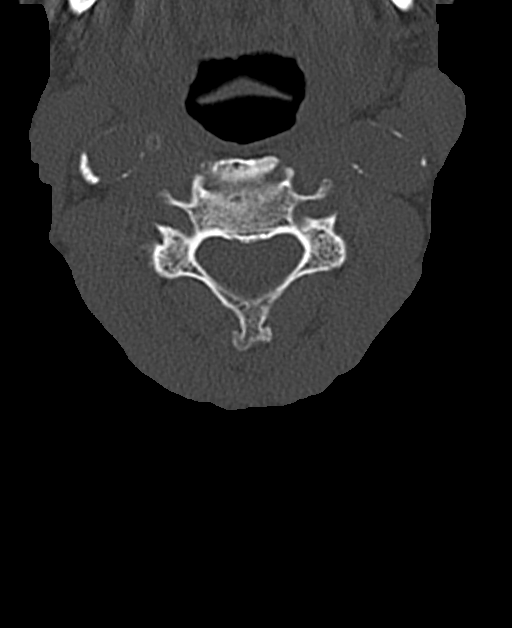
[im 92/108  bone]
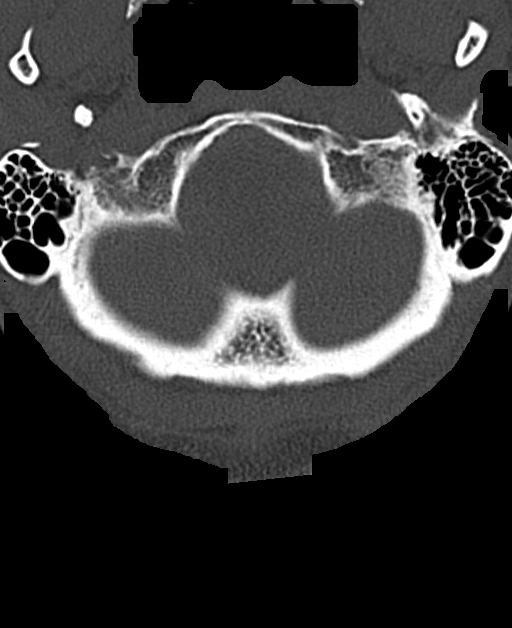

[12 of 33 positions shown; findings below may reference images not displayed]

FINDINGS: Alignment: Straightening and slight kyphotic curvature of the
cervical spine. No traumatic malalignment.

Skull base and vertebrae: No fracture or focal lesion. Chronic
fusion of the posterior elements on the left at C2-3.

Soft tissues and spinal canal: No sign of soft tissue injury.

Disc levels: Foramen magnum is widely patent. Ordinary degenerative
change of the C1-2 articulation without encroachment upon the neural
structures

C2-3: Chronic posterior element fusion. Wide patency of the canal
and foramina.

C3-4: Degenerative spondylosis. Mild bilateral bony foraminal
narrowing.

C4-5: Degenerative spondylosis. Bilateral foraminal narrowing left
more than right.

C5-6: Degenerative spondylosis.  Bilateral bony foraminal narrowing.

C6-7: Spondylosis with prominent endplate osteophytes and possible
calcified chronic disc herniation. Moderate stenosis of the central
canal. Bilateral bony foraminal narrowing.

C7-T1: Bilateral uncovertebral osteophytes with mild bilateral
foraminal narrowing.

Upper chest: Negative

Other: None
IMPRESSION: No acute or traumatic finding. Degenerative changes throughout the
cervical region as outlined above. Foraminal stenosis from C3-4
through C7-T1 which could be symptomatic. Bony canal narrowing at
C6-7 due to endplate osteophytes and chronic calcified disc
material.
